# Patient Record
Sex: Female | Born: 1965 | Race: White | Hispanic: Yes | Marital: Married
Health system: Southern US, Community
[De-identification: ages and names within clinical notes are randomized; demographics above are authoritative.]

---

## 2008-06-22 ENCOUNTER — Ambulatory Visit (HOSPITAL_BASED_OUTPATIENT_CLINIC_OR_DEPARTMENT_OTHER): Admission: RE | Admit: 2008-06-22 | Discharge: 2008-06-22 | Payer: Self-pay | Admitting: Orthopedic Surgery

## 2011-01-23 NOTE — Op Note (Signed)
NAMESHALAINE, Krista Davenport                  ACCOUNT NO.:  [id number]   MEDICAL RECORD NO.:  [id number]          PATIENT TYPE:  AMB   LOCATION:  DSC                          FACILITY:  [facility]   PHYSICIAN:  Leonides Grills, M.D.     DATE OF BIRTH:  11/10/1965   DATE OF PROCEDURE:  06/22/2008  DATE OF DISCHARGE:                               OPERATIVE REPORT   PREOPERATIVE DIAGNOSES:  1. Right medial talar dome osteochondral lesion.  2. Right anterior ankle impingement.  3. Right sinus tarsi impingement.   POSTOPERATIVE DIAGNOSES:  1. Right medial talar dome osteochondral lesion.  2. Right anterior ankle impingement.  3. Right sinus tarsi impingement.   OPERATION:  1. Right ankle arthroscopy with extensive debridement.  2. Right debridement and drilling medial talar dome osteochondral      lesion.  3. Right subtalar arthroscopy with sinus tarsi debridement.   ANESTHESIA:  General.   SURGEON:  Leonides Grills, MD   ASSISTANT:  Richardean Canal, PA   ESTIMATED BLOOD LOSS:  Minimal.   TOURNIQUET:  None.   COMPLICATION:  None.   DISPOSITION:  Stable to the PR.   INDICATIONS:  This is a  45 year old female who has had persistent  anterolateral and anteromedial ankle pain that was interfering with her  life to do what she wants to.  She was consented to the above procedure.  All risks of infection or vessel injury, persistent pain, worsening  pain, prolonged recovery, stiffness, arthritis, recurrence of the  osteochondral lesion, and possibility of future debridement drilling of  the lesion were all explained.  Questions were encouraged and answered.  This was all discussed with the patient through an interpreter   OPERATION:  The patient was brought to the operating room and placed in supine  position.  Initially after adequate general endotracheal tube anesthesia  was administered as well as Ancef 1 gram IV piggyback, a bump placed in  the right ipsilateral hip, silicone type internally  rotating the right  lower extremity and the right lower extremity was then prepped and  draped in sterile manner.  No tourniquet was used.  Anatomical landmarks  include the lateral malleolus, peroneus tertius, and anterior tibialis  tendon were mapped out.  Superficial peroneal nerve could not be seen.  We started the procedure with the subtalar arthroscopy.  We created the  anterior portal after a spinal needle was placed into the subtalar joint  and 20 mL normal saline was instilled in the subtalar joint.  We then  utilized a nick and spread technique creating the anterior portal and  dissected down bluntly in a nick and spread technique to the subtalar  joint.  Blunt tip trocar with cannula followed by camera was then placed  into the ankle and the medial portal just anterior to the tip of the  lateral malleolus was then made with a spinal needle, again followed by  nick and spread technique.  There was a large amount of synovitis in the  sinus tarsi area and with range of motion of the subtalar joint this was  flapping  in the joint and impinging in this area.  This was then  extensively debrided with a shaver as well as a radiofrequency bevel.  There was a corner of the anterior process of the calcaneus that had a  lesion on this corner and with range of motion, it was pinching in this  area.  This was also debrided as well.  This extended into the lateral  gutter,0 which was also debrided.  Range of motion of the subtalar joint  did not show any impingement.  There was no other areas of inflammation.  Pictures were obtained throughout the procedure.  Camera was removed.  We then placed a spinal needle just medial to the anterior tibialis  tendon into the ankle joint and 20 mL normal saline instilled in the  ankle joint.  [name] and spread technique was then utilized to create the  anteromedial portal just medial to the anterior tibialis tendon.  Blunt  tip trocar with cannula followed  by camera was then placed in the ankle  and under direct visualization anterolateral portal was created with a  spinal needle followed by nick spread technique.  The accessory tib-fib  ligament was spanning over the anterior lateral edge of the talus and  was literally denting the lateral talar dome.  There was synovitis  surrounding this area as well.  The accessory tib-fib ligament was then  removed and a debridement extensively was done with a shaver as well as  with a radiofrequency bevel.  Once this completely debrided back, the  ankle was ranged and there was no impingement in this area and no other  remaining synovitis.  Pictures were obtained throughout the procedure.  We then placed the camera anterolaterally visualizing medially, again  more synovitis in this area.  This was also debrided with a shaver as  well as by a radiofrequency bevel.  There was an obvious osteochondral  lesion and that was quite large on the medial talar dome.  This was then  debrided with a House curette as well as a shaver down to bone.  There  was a cystic area underneath this area as well and was soft.  We then  debrided this area and drilled it with microfractures technique.  We  limited inflow and this bled beautifully.  There was no loose bodies in  this area and the area was carefully debrided to normal cartilage.  Ankle was ranged and there was no impingement.  Pictures again were  obtained throughout the procedure.  Cameras were removed.  All wounds  were closed with 4-0 nylon stitch.  Sterile dressing was applied.  Modified Jones dressing was applied with the ankle in dorsiflexion.  The  patient went stable to the PR.      Leonides Grills, M.D.  Electronically Signed     PB/MEDQ  D:  06/22/2008  T:  06/23/2008  Job:  [id number]

## 2011-06-12 LAB — POCT HEMOGLOBIN-HEMACUE: Hemoglobin: 13.5

## 2015-04-21 DIAGNOSIS — M899 Disorder of bone, unspecified: Secondary | ICD-10-CM | POA: Insufficient documentation

## 2015-06-16 DIAGNOSIS — M7671 Peroneal tendinitis, right leg: Secondary | ICD-10-CM | POA: Insufficient documentation

## 2015-11-10 DIAGNOSIS — G4733 Obstructive sleep apnea (adult) (pediatric): Secondary | ICD-10-CM | POA: Insufficient documentation

## 2015-11-10 DIAGNOSIS — J4520 Mild intermittent asthma, uncomplicated: Secondary | ICD-10-CM | POA: Insufficient documentation

## 2018-02-20 DIAGNOSIS — M1712 Unilateral primary osteoarthritis, left knee: Secondary | ICD-10-CM | POA: Insufficient documentation

## 2018-03-04 DIAGNOSIS — S83272A Complex tear of lateral meniscus, current injury, left knee, initial encounter: Secondary | ICD-10-CM | POA: Insufficient documentation

## 2021-09-15 ENCOUNTER — Other Ambulatory Visit: Payer: Self-pay

## 2021-09-15 ENCOUNTER — Ambulatory Visit (INDEPENDENT_AMBULATORY_CARE_PROVIDER_SITE_OTHER): Payer: BC Managed Care – PPO

## 2021-09-15 ENCOUNTER — Ambulatory Visit (INDEPENDENT_AMBULATORY_CARE_PROVIDER_SITE_OTHER): Payer: BC Managed Care – PPO | Admitting: Sports Medicine

## 2021-09-15 DIAGNOSIS — M7711 Lateral epicondylitis, right elbow: Secondary | ICD-10-CM

## 2021-09-15 DIAGNOSIS — G8929 Other chronic pain: Secondary | ICD-10-CM

## 2021-09-15 DIAGNOSIS — M7712 Lateral epicondylitis, left elbow: Secondary | ICD-10-CM

## 2021-09-15 DIAGNOSIS — M79671 Pain in right foot: Secondary | ICD-10-CM

## 2021-09-15 IMAGING — DX DG FOOT COMPLETE 3+V*R*
3 series · 3 of 3 positions shown · non-contrast
Comparison: None.

CLINICAL DATA: Pain over the second and third metatarsals.

EXAM:
RIGHT FOOT COMPLETE - 3+ VIEW

[foot ap]
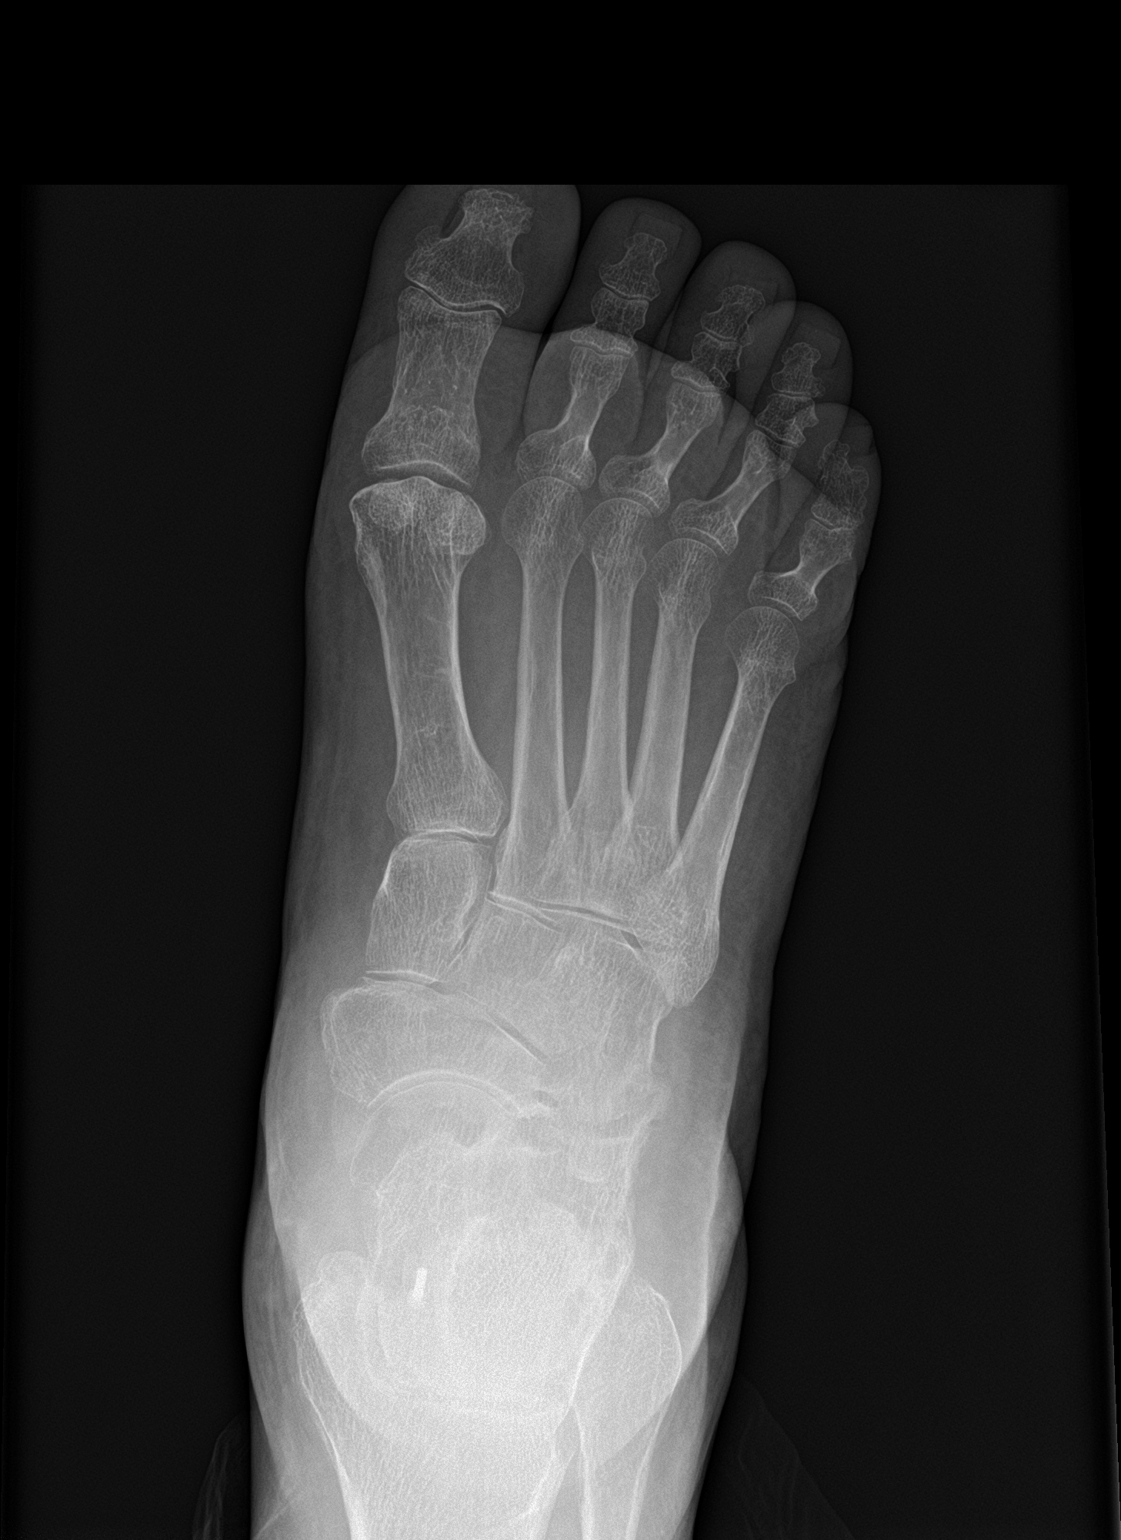

[foot obl]
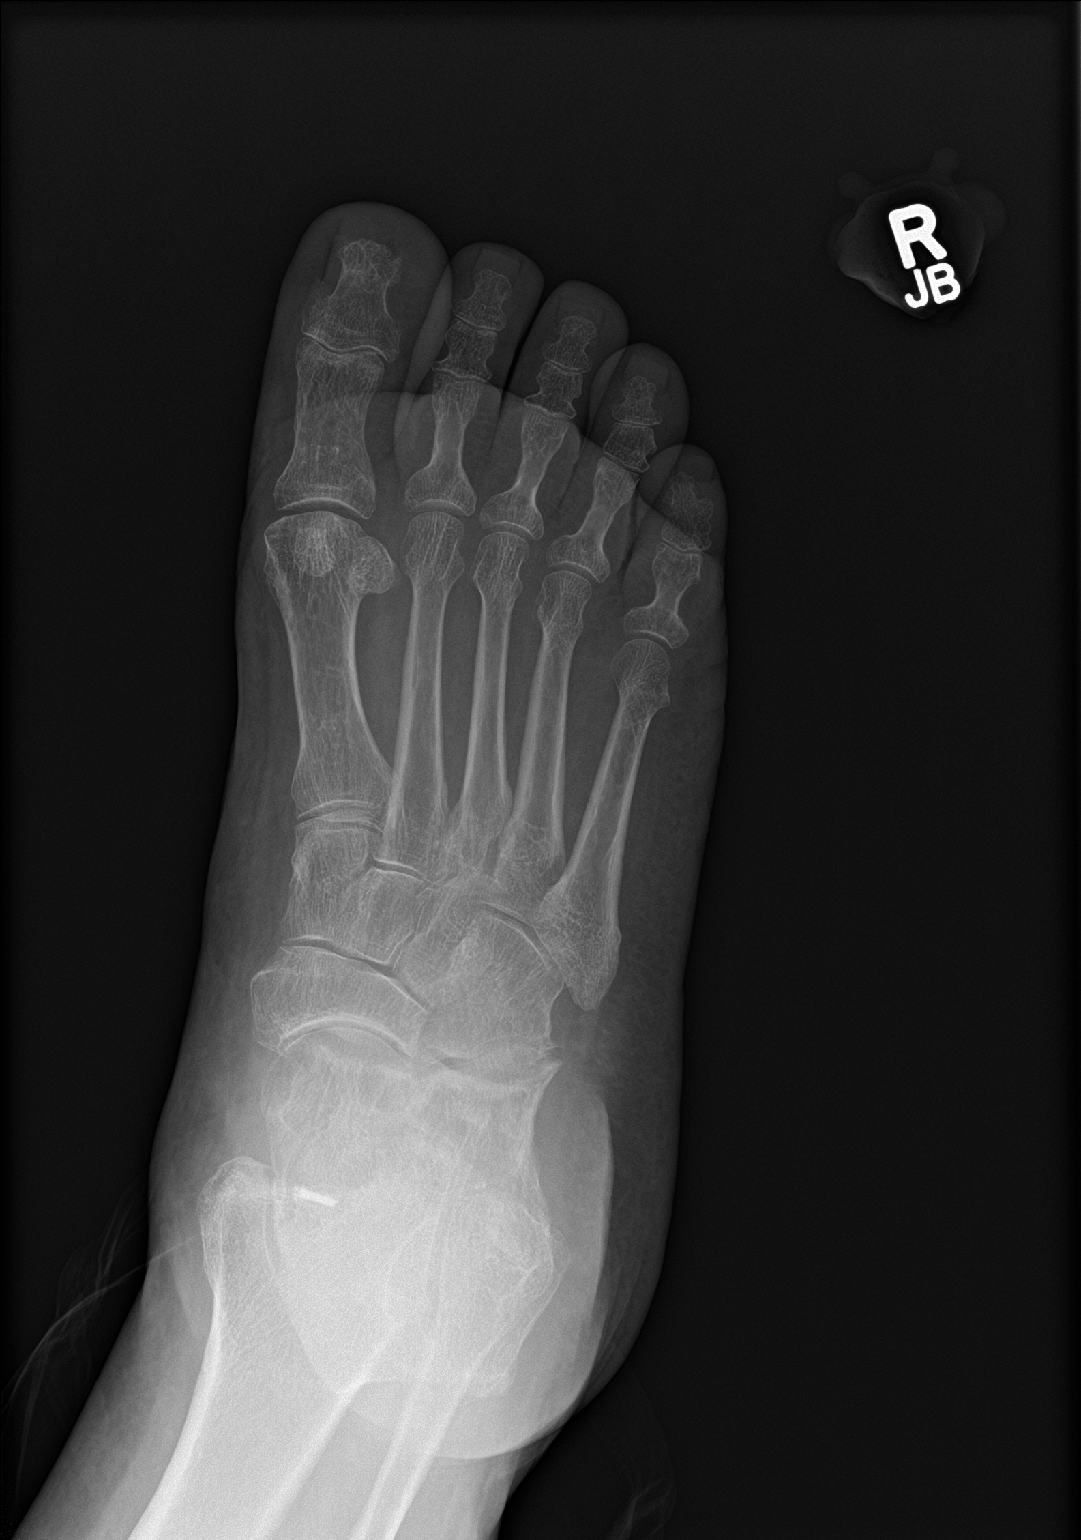

[foot lat]
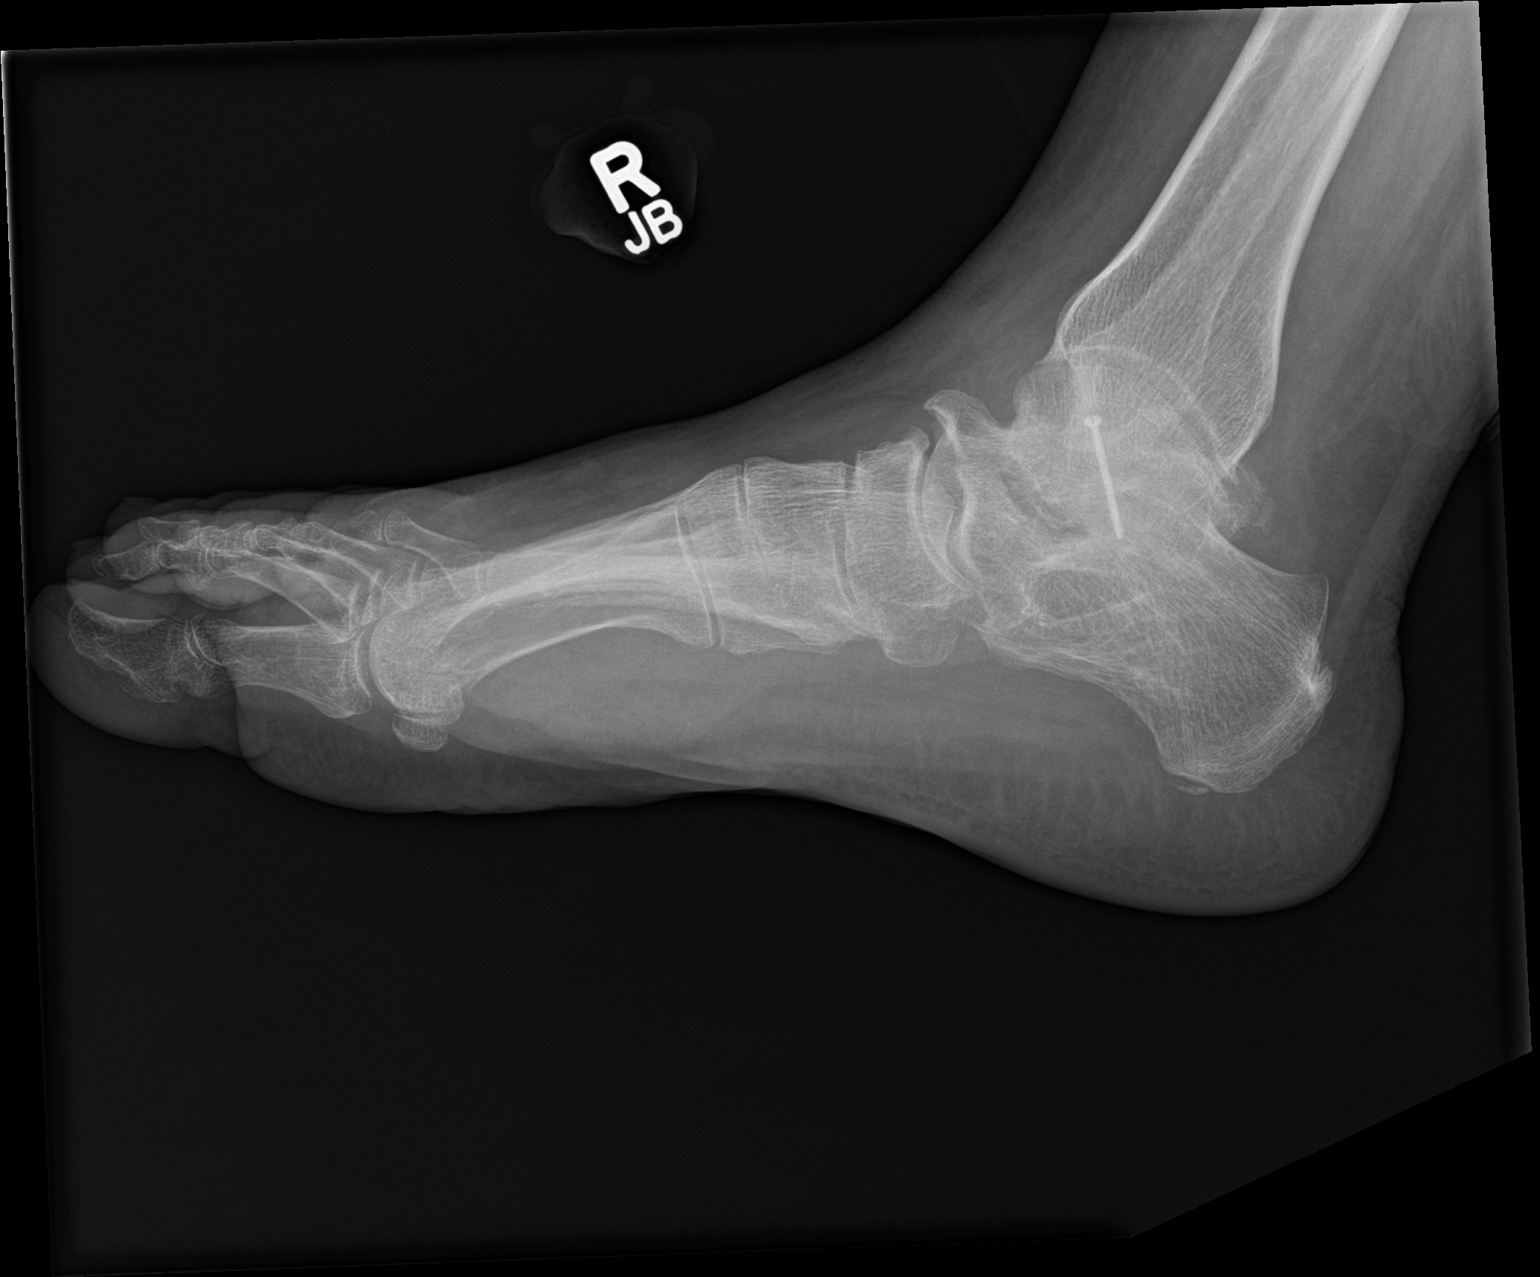

[3 of 3 positions shown; findings below may reference images not displayed]

FINDINGS: No acute fracture or dislocation. The bones are osteopenic.
Degenerative changes of the ankle and hindfoot and talar calcaneal
fusion. A fixation pin is noted. The soft tissues are grossly
unremarkable.
IMPRESSION: No acute fracture or dislocation.

## 2021-09-15 IMAGING — DX DG ELBOW COMPLETE 3+V*R*
4 series · 4 of 4 positions shown · non-contrast
Comparison: None.

CLINICAL DATA: Pain lateral elbow. Lateral epicondylitis. Painful
range of motion.

EXAM:
RIGHT ELBOW - COMPLETE 3+ VIEW

[elbow ap]
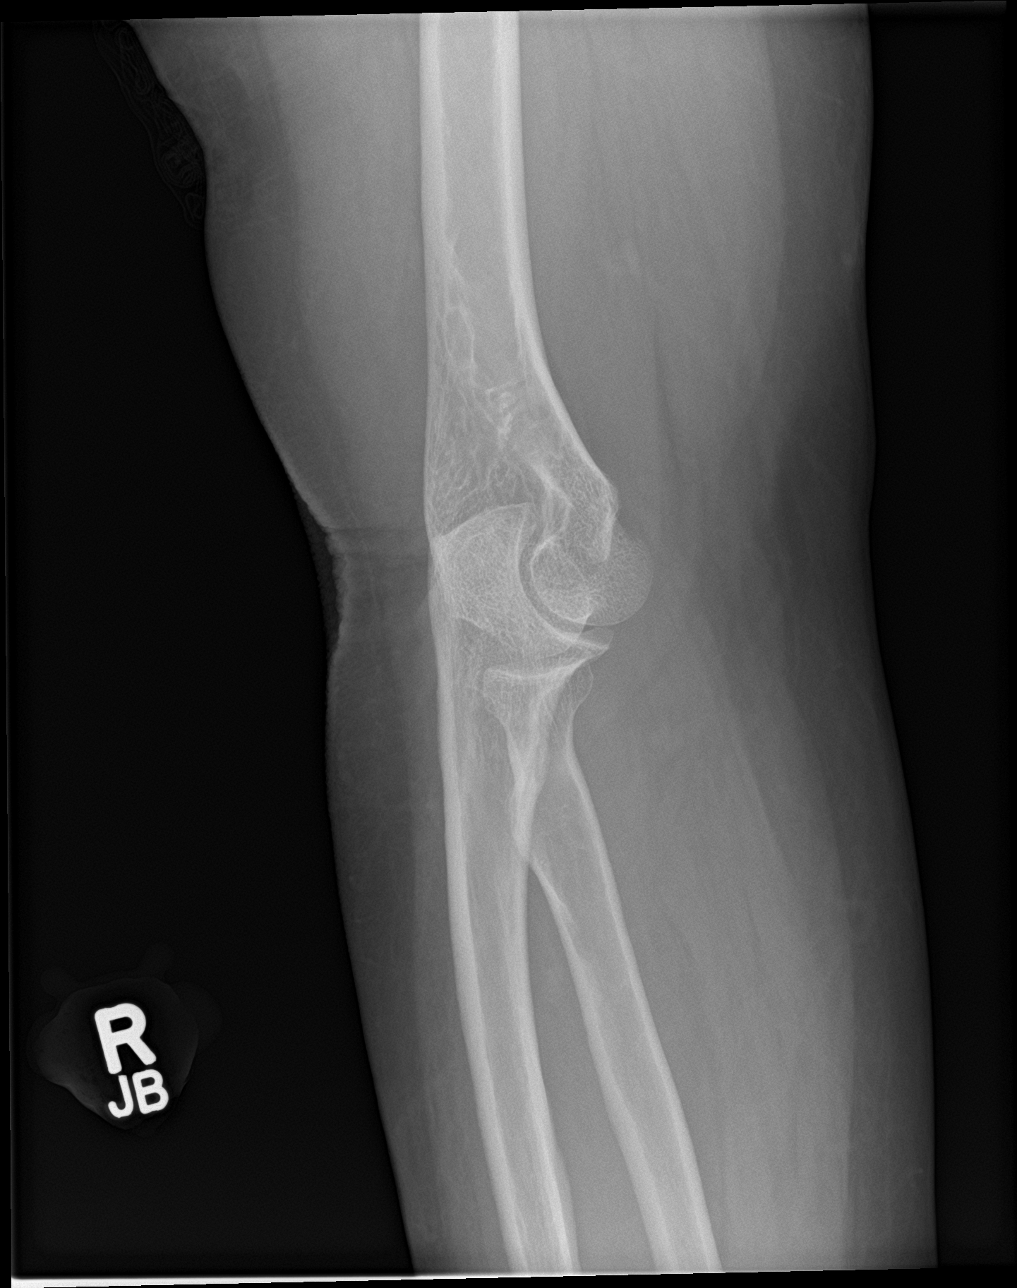

[elbow obl (1 of 2)]
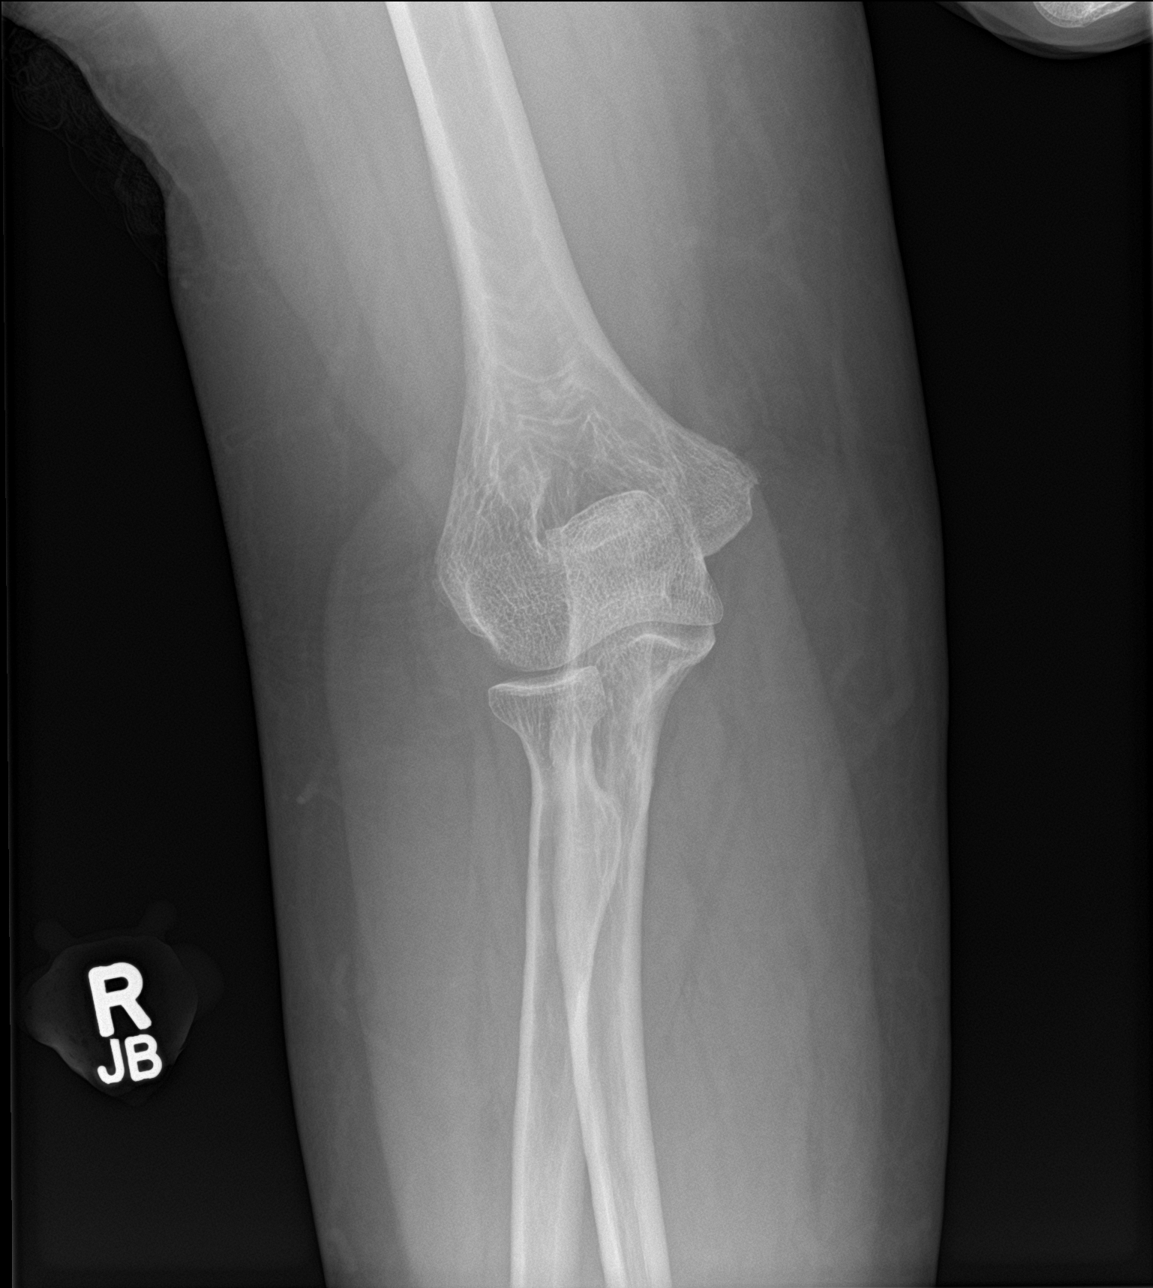

[elbow obl (2 of 2)]
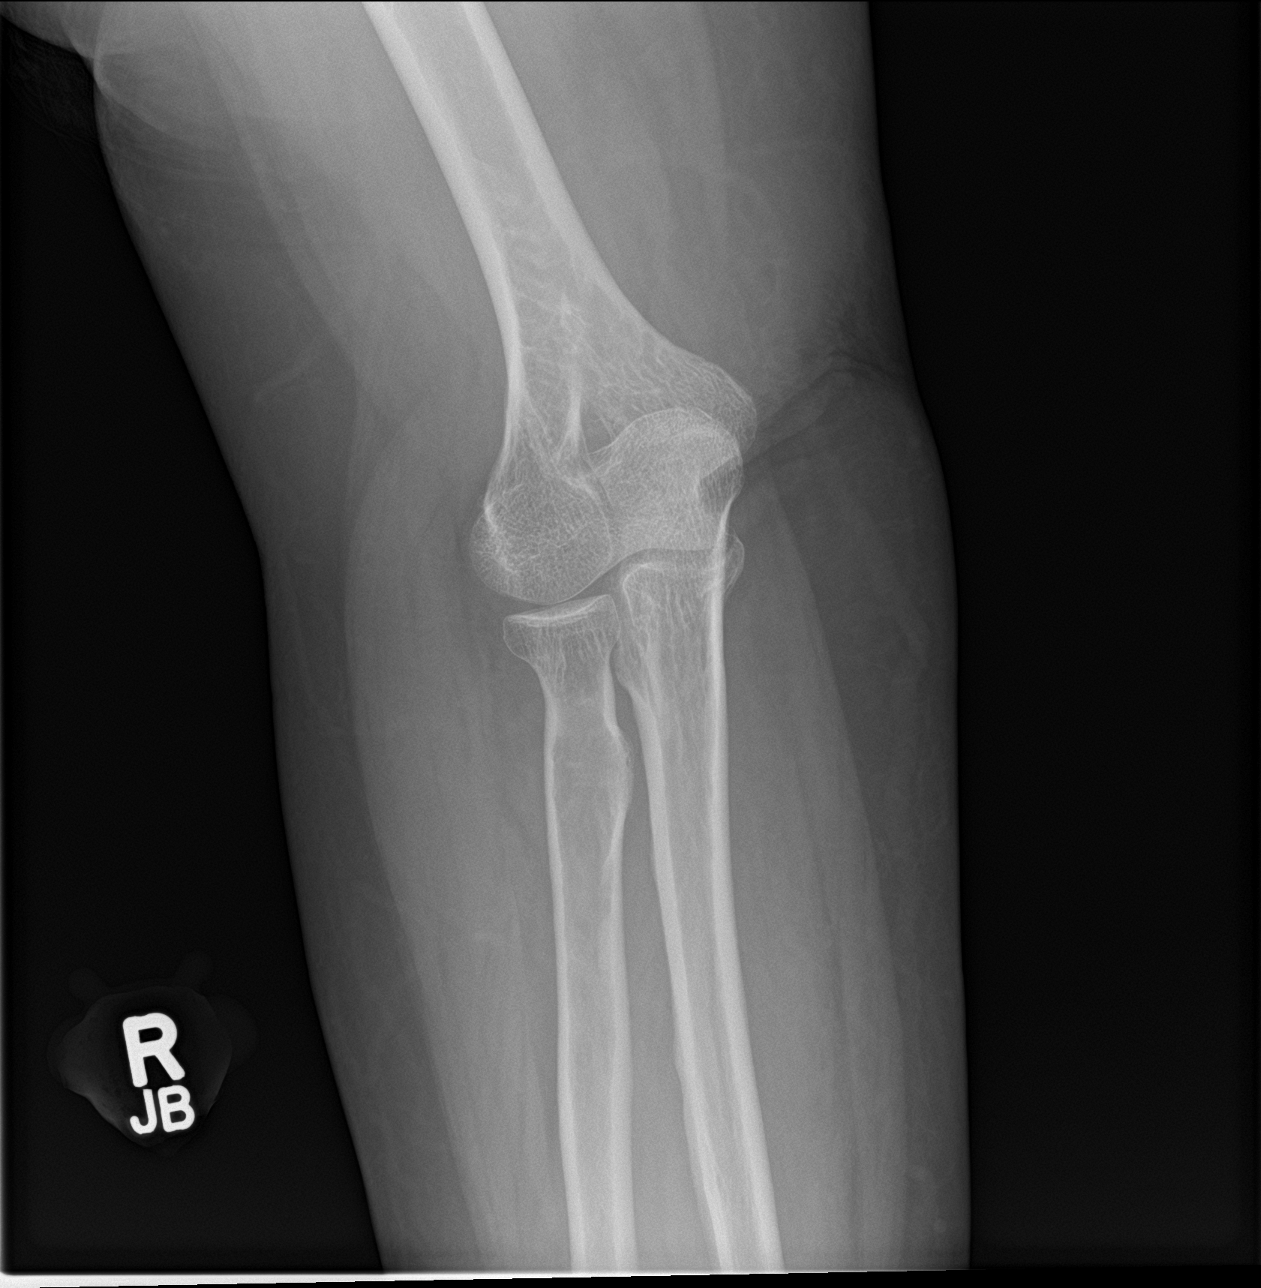

[elbow lat]
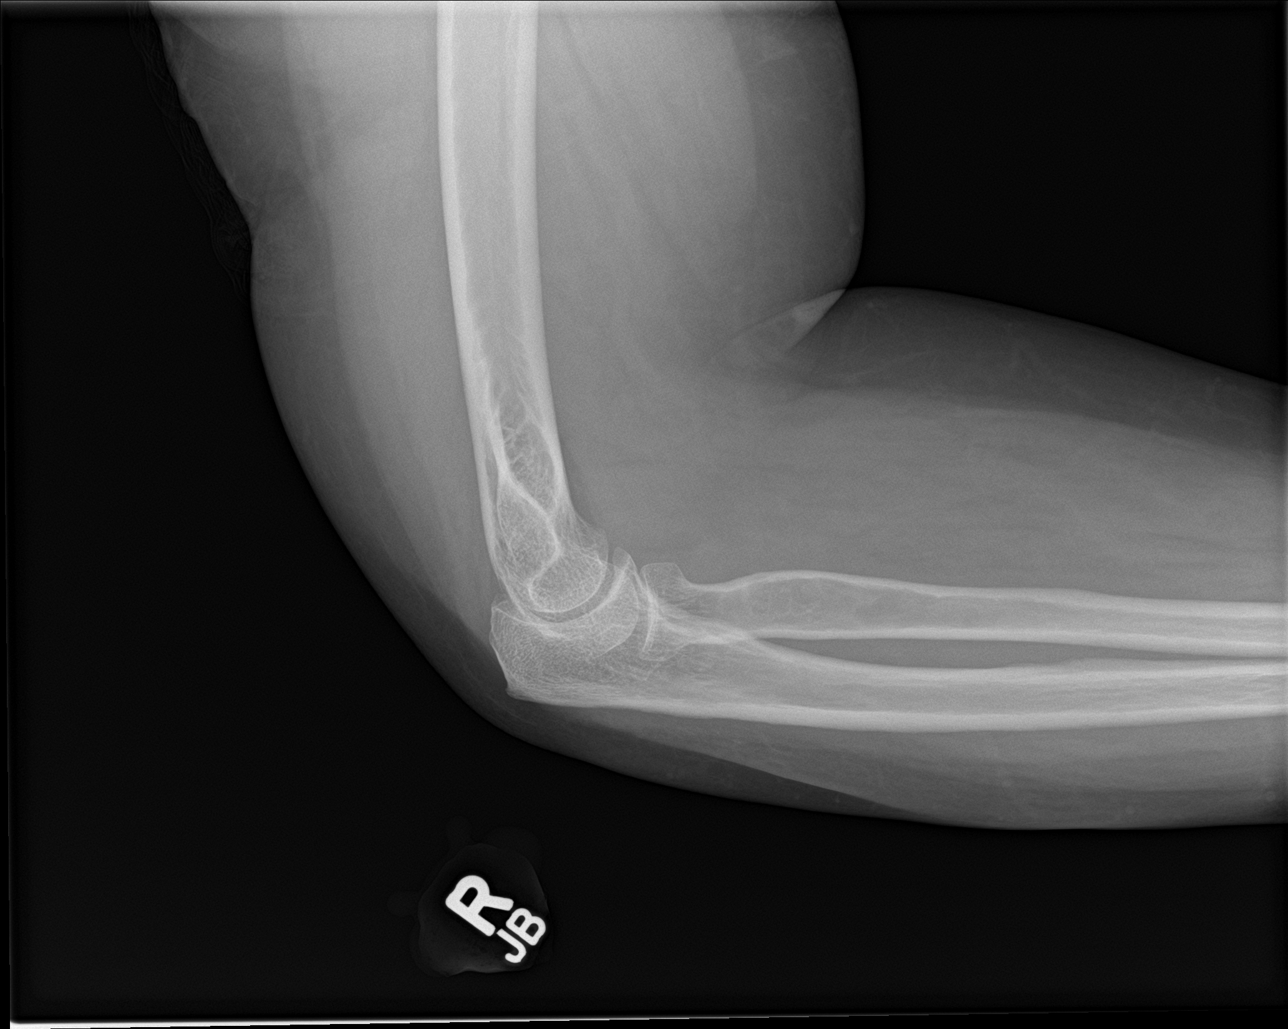

[4 of 4 positions shown; findings below may reference images not displayed]

FINDINGS: There is no evidence of fracture, dislocation, or joint effusion.
Normal alignment. Joint spaces are preserved. There is no evidence
of arthropathy or other focal bone abnormality. Soft tissues are
unremarkable.
IMPRESSION: Negative radiographs of the right elbow.

## 2021-09-15 MED ORDER — MELOXICAM 15 MG PO TABS: ORAL_TABLET | ORAL | 3 refills | Status: AC

## 2021-09-15 MED ORDER — MELOXICAM 15 MG PO TABS: ORAL_TABLET | ORAL | 3 refills | Status: DC

## 2021-09-15 NOTE — Assessment & Plan Note (Signed)
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 On exam she has swelling and tenderness second and third mid metatarsal shafts concerning for stress injury. We will get x-rays of her foot, she also has a cam boot at home from her ankle surgery, she will wear this for the next month, I did explain that this could take 12 weeks to get better.

## 2021-09-15 NOTE — Progress Notes (Signed)
° ° °  Procedures performed today:    None.  Independent interpretation of notes and tests performed by another provider:   None.  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 On exam she has swelling and tenderness second and third mid metatarsal shafts concerning for stress injury. We will get x-rays of her foot, she also has a cam boot at home from her ankle surgery, she will wear this for the next month, I did explain that this could take 12 weeks to get better.  Lateral epicondylitis of both elbows Over a month of pain right lateral elbow, worse with gripping motions, tenderness at the common extensor tendon origin, worse with resisted extension of the wrist, this is classic tennis elbow, adding tennis elbow brace, meloxicam, x-rays, formal physical therapy. Return to see me in 4 weeks, injection if no better.  Chronic process with exacerbation and pharmacologic intervention  ___________________________________________ Gwen Her. Dianah Field, M.D., ABFM., CAQSM. Primary Care and [place] Instructor of [place] [employer] [facility] [employer]

## 2021-09-15 NOTE — Addendum Note (Signed)
Addended by: Gust Brooms on: 09/15/2021 04:30 PM   Modules accepted: Orders

## 2021-09-15 NOTE — Assessment & Plan Note (Signed)
Over a month of pain right lateral elbow, worse with gripping motions, tenderness at the common extensor tendon origin, worse with resisted extension of the wrist, this is classic tennis elbow, adding tennis elbow brace, meloxicam, x-rays, formal physical therapy. Return to see me in 4 weeks, injection if no better.

## 2021-10-06 ENCOUNTER — Ambulatory Visit: Payer: BC Managed Care – PPO | Admitting: Physical Therapy

## 2021-10-13 ENCOUNTER — Other Ambulatory Visit: Payer: Self-pay

## 2021-10-13 ENCOUNTER — Ambulatory Visit: Payer: BC Managed Care – PPO | Admitting: Sports Medicine

## 2021-10-13 DIAGNOSIS — M79671 Pain in right foot: Secondary | ICD-10-CM | POA: Diagnosis not present

## 2021-10-13 DIAGNOSIS — M7712 Lateral epicondylitis, left elbow: Secondary | ICD-10-CM | POA: Diagnosis not present

## 2021-10-13 DIAGNOSIS — G8929 Other chronic pain: Secondary | ICD-10-CM | POA: Diagnosis not present

## 2021-10-13 DIAGNOSIS — M7711 Lateral epicondylitis, right elbow: Secondary | ICD-10-CM

## 2021-10-13 NOTE — Progress Notes (Signed)
° ° °  Procedures performed today:    None.  Independent interpretation of notes and tests performed by another provider:   None.  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 X-rays showed arthritic changes, she has failed greater than 6 weeks of physician directed conservative treatments we will not proceed with MRI of the foot and ankle for injection planning. We will put her on light duty at work, I do not think she wear her boot very much.  Lateral epicondylitis of both elbows Resolved with conservative measures.  I spent 30 minutes of total time managing this patient today, this includes chart review, face to face, and non-face to face time.  ___________________________________________ Ihor Austin. Benjamin Stain, M.D., ABFM., CAQSM. Primary Care and Sports Medicine [facility]  Adjunct Instructor of Family Medicine  [employer]

## 2021-10-13 NOTE — Assessment & Plan Note (Signed)
Resolved with conservative measures. 

## 2021-10-13 NOTE — Assessment & Plan Note (Signed)
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 X-rays showed arthritic changes, she has failed greater than 6 weeks of physician directed conservative treatments we will not proceed with MRI of the foot and ankle for injection planning. We will put her on light duty at work, I do not think she wear her boot very much.

## 2021-10-16 ENCOUNTER — Other Ambulatory Visit: Payer: Self-pay

## 2021-10-21 ENCOUNTER — Ambulatory Visit (INDEPENDENT_AMBULATORY_CARE_PROVIDER_SITE_OTHER): Payer: BC Managed Care – PPO

## 2021-10-21 ENCOUNTER — Other Ambulatory Visit: Payer: Self-pay

## 2021-10-21 DIAGNOSIS — M79671 Pain in right foot: Secondary | ICD-10-CM

## 2021-10-21 DIAGNOSIS — G8929 Other chronic pain: Secondary | ICD-10-CM | POA: Diagnosis not present

## 2021-10-21 IMAGING — MR MR ANKLE*R* W/O CM
5 series · 40 of 40 positions shown · non-contrast
Comparison: None.

CLINICAL DATA: Dorsal foot pain along the second and third
metatarsals. Right ankle pain for 15 years. Prior right ankle
surgery [DATE].

EXAM:
MRI OF THE RIGHT ANKLE WITHOUT CONTRAST
MRI OF THE RIGHT FOREFOOT WITHOUT CONTRAST
TECHNIQUE: Multiplanar, multisequence MR imaging of the ankle was performed. No
intravenous contrast was administered.
Multiplanar, multisequence MR imaging of the foot was performed. No

[Series 4: PD fat-sat · axial · 3.0mm · 0.70mm/px · z∈[-72,+67]mm · 9 of 43 slices shown]
[im 1/43]
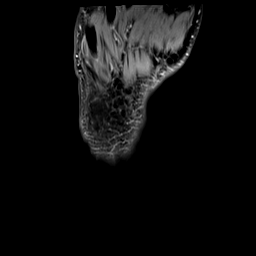
[im 6/43]
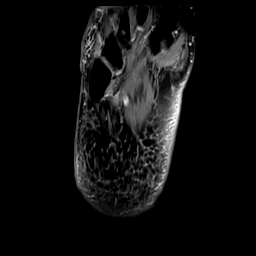
[im 11/43]
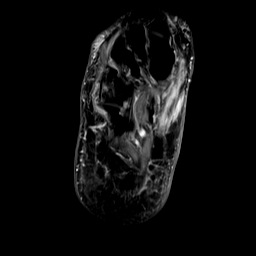
[im 16/43]
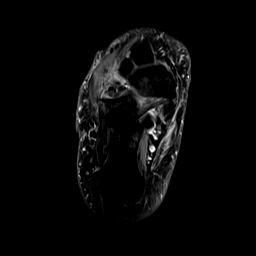
[im 22/43]
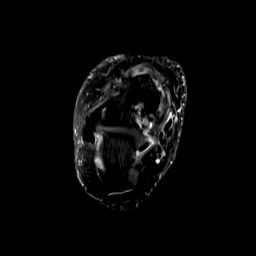
[im 27/43]
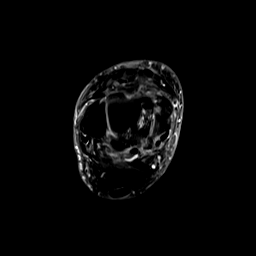
[im 32/43]
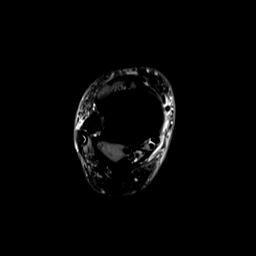
[im 37/43]
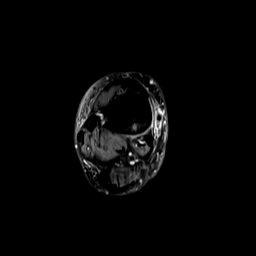
[im 43/43]
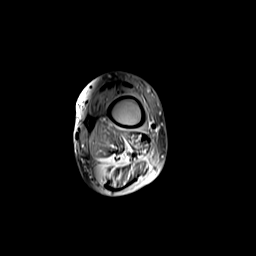

[Series 5: T2 fat-sat · axial · 3.0mm · 0.70mm/px · z∈[-72,+67]mm · 9 of 43 slices shown]
[im 1/43]
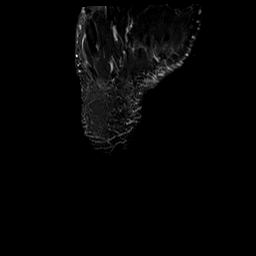
[im 6/43]
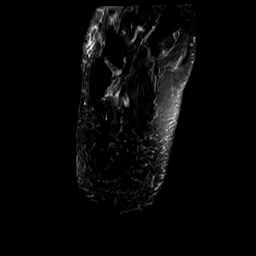
[im 11/43]
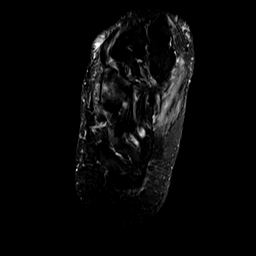
[im 16/43]
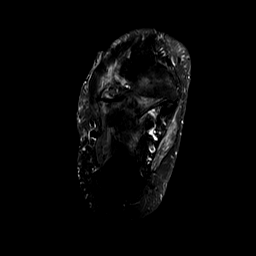
[im 22/43]
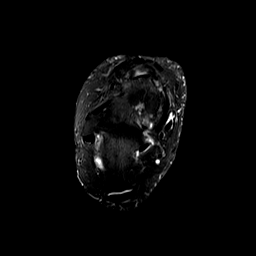
[im 27/43]
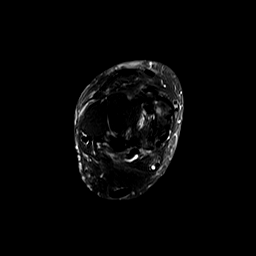
[im 32/43]
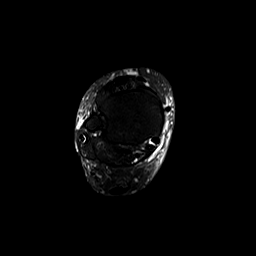
[im 37/43]
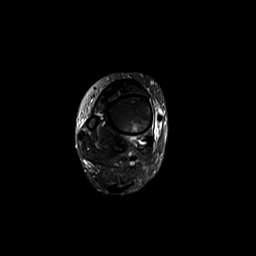
[im 43/43]
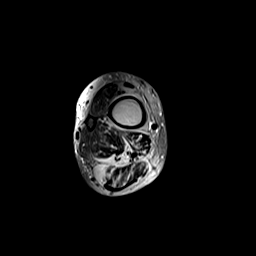

[Series 6: STIR · coronal · 3.0mm · 0.70mm/px · 10 of 45 slices shown (1 of 2)]
[im 1/45]
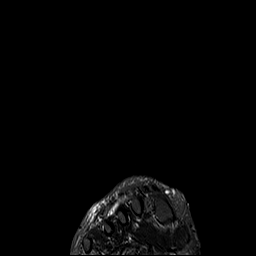
[im 5/45]
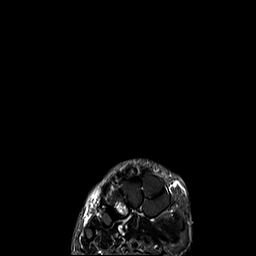
[im 10/45]
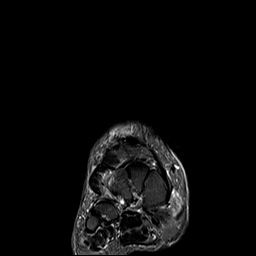
[im 15/45]
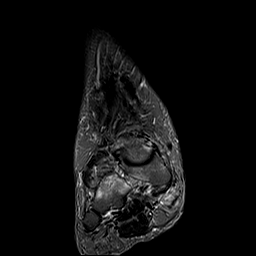
[im 20/45]
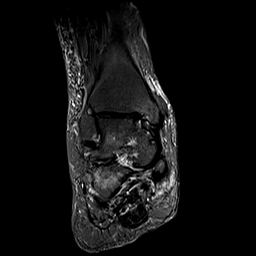
[im 25/45]
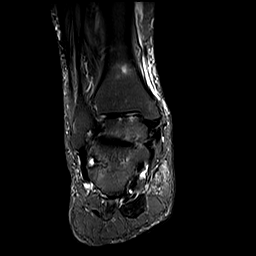
[im 30/45]
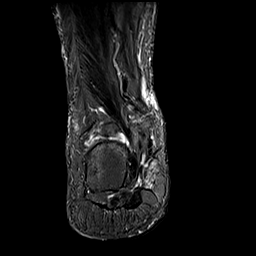
[im 35/45]
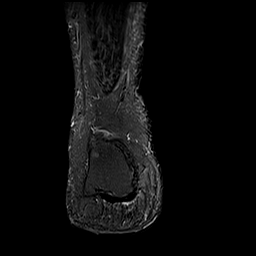
[im 40/45]
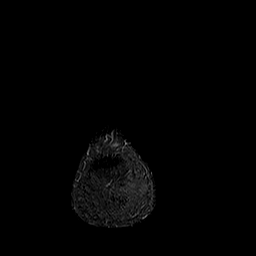
[im 45/45]
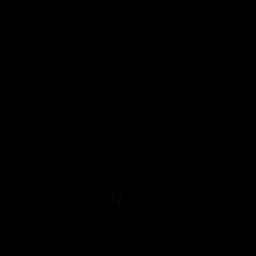

[Series 7: T1 · sagittal · 3.0mm · 0.56mm/px · 6 of 26 slices shown]
[im 1/26]
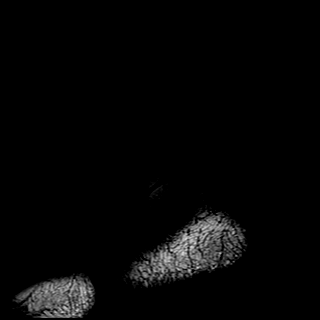
[im 6/26]
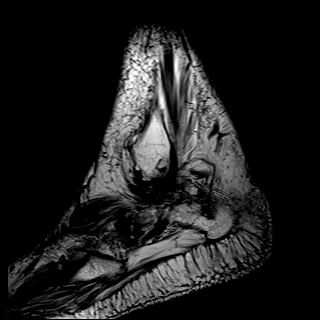
[im 11/26]
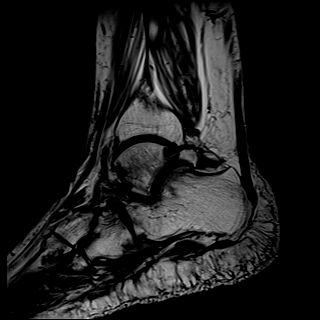
[im 16/26]
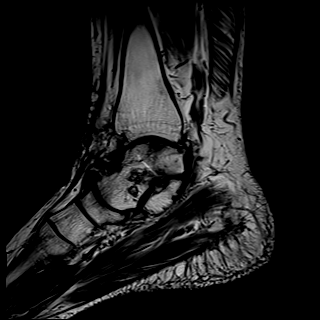
[im 21/26]
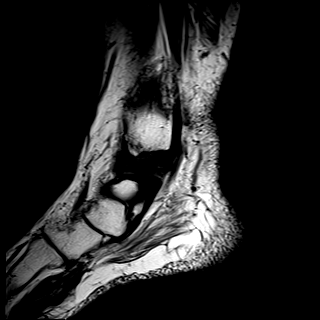
[im 26/26]
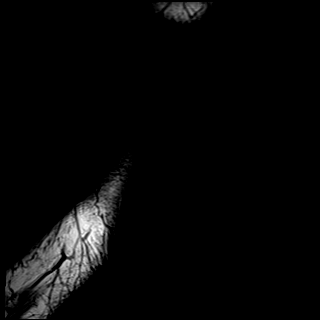

[Series 8: STIR · sagittal · 3.0mm · 0.70mm/px · 6 of 26 slices shown (2 of 2)]
[im 1/26]
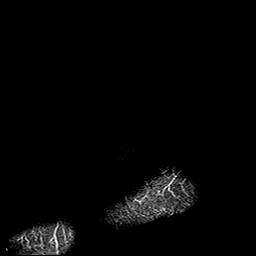
[im 6/26]
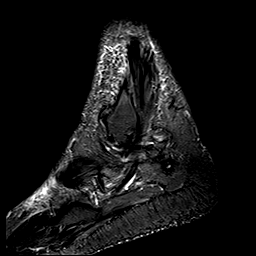
[im 11/26]
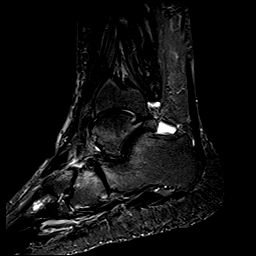
[im 16/26]
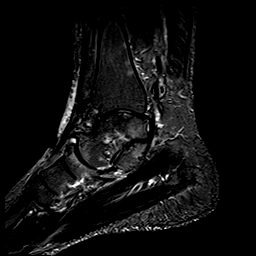
[im 21/26]
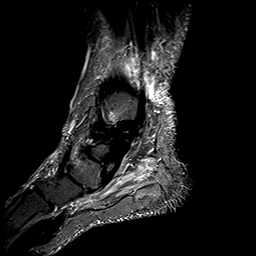
[im 26/26]
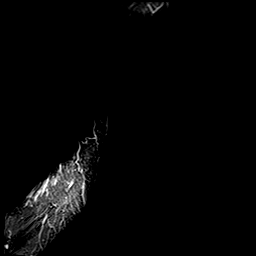

[40 of 40 positions shown; findings below may reference images not displayed]

FINDINGS: TENDONS

Peroneal: Mild tendinosis of the peroneus longus with mild
tenosynovitis. Peroneal brevis intact.

Posteromedial: Posterior tibial tendon intact. Flexor hallucis
longus tendon intact. Flexor digitorum longus tendon intact.

Anterior: Tibialis anterior tendon intact. Extensor hallucis longus
tendon intact Extensor digitorum longus tendon intact.

Achilles:  Intact.

Plantar Fascia: Intact.

LIGAMENTS

Lateral: Anterior talofibular ligament intact. Calcaneofibular
ligament intact. Posterior talofibular ligament intact. Anterior and
posterior tibiofibular ligaments intact.

Medial: Deltoid ligament intact. Spring ligament intact.

Lisfranc: Intact

Collaterals: Intact

CARTILAGE

Ankle Joint: No joint effusion. Single screw in the anteromedial
aspect of the talar dome. Full-thickness cartilage loss throughout
the tibiotalar joint with focal subchondral reactive marrow edema
involving the medial talar dome consistent with severe
osteoarthritis. Postsurgical changes in fibrosis along the anterior
margin of the tibiotalar joint.

Subtalar Joints/Sinus Tarsi: Normal sinus tarsi. Mild osteoarthritis
of the posterior subtalar joint with subchondral reactive marrow
edema.

Bones: Focal severe bone marrow edema at the base of the third
metatarsal most concerning for stress reaction without a fracture.
Mild osteoarthritis of the first MTP joint. Moderate osteoarthritis
of the talonavicular joint with subchondral reactive marrow edema
and a bulky dorsal anterior talar osteophyte. Moderate
osteoarthritis of the calcaneocuboid joint with subchondral reactive
marrow edema.

Soft Tissue: No fluid collection or hematoma. Muscles are normal
without edema or atrophy. Tarsal tunnel is normal.
IMPRESSION: 1. Mild tendinosis of the peroneus longus with mild tenosynovitis.
2. Severe osteoarthritis of the tibiotalar joint.
3. Focal severe bone marrow edema at the base of the third
metatarsal most concerning for stress reaction without a fracture.
4. Mild osteoarthritis of the first MTP joint.
5. Moderate osteoarthritis of the talonavicular joint with
subchondral reactive marrow edema and a bulky dorsal anterior talar
osteophyte.
6. Moderate osteoarthritis of the calcaneocuboid joint with
subchondral reactive marrow edema.

## 2021-11-01 ENCOUNTER — Telehealth: Payer: Self-pay

## 2021-11-01 NOTE — Telephone Encounter (Signed)
Due to language barrier, spoke with daughter regarding results. She is aware of the results and recommendations and will notify patient. She was transferred to the front desk for scheduling of injection as recommended.

## 2021-11-10 ENCOUNTER — Ambulatory Visit (INDEPENDENT_AMBULATORY_CARE_PROVIDER_SITE_OTHER): Payer: BC Managed Care – PPO

## 2021-11-10 ENCOUNTER — Ambulatory Visit: Payer: BC Managed Care – PPO | Admitting: Sports Medicine

## 2021-11-10 ENCOUNTER — Other Ambulatory Visit: Payer: Self-pay

## 2021-11-10 DIAGNOSIS — M79671 Pain in right foot: Secondary | ICD-10-CM

## 2021-11-10 DIAGNOSIS — G8929 Other chronic pain: Secondary | ICD-10-CM

## 2021-11-10 MED ORDER — HYDROCODONE-ACETAMINOPHEN 5-325 MG PO TABS: 1.0000 | ORAL_TABLET | Freq: Three times a day (TID) | ORAL | 0 refills | Status: DC | PRN

## 2021-11-10 NOTE — Progress Notes (Signed)
? ? ?  Procedures performed today:   ? ?Procedure: Real-time Ultrasound Guided injection of the right tibiotalar joint ?Device: Samsung HS60  ?Verbal informed consent obtained.  ?Time-out conducted.  ?Noted no overlying erythema, induration, or other signs of local infection.  ?Skin prepped in a sterile fashion.  ?Local anesthesia: Topical Ethyl chloride.  ?With sterile technique and under real time ultrasound guidance: Noted synovitis and severe arthritic change, 1 cc kenalog 40, 1 cc lidocaine, 1 cc bupivacaine injected easily. ?Completed without difficulty  ?Advised to call if fevers/chills, erythema, induration, drainage, or persistent bleeding.  ?Images permanently stored and available for review in PACS.  ?Impression: Technically successful ultrasound guided injection. ? ?Independent interpretation of notes and tests performed by another provider:  ? ?None. ? ?Brief History, Exam, Impression, and Recommendations:   ? 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 ?There is also a stress injury base of the third metatarsal. ?Today her pain is predominantly at the tibiotalar joint, this was injected today with ultrasound guidance and she reported over 70% improvement seconds after the injection. ?Because she has multiple other pathologic findings including potential stress injury at the base of the third metatarsal I would like her to wear a cam boot for the next month, I would like to see her back at which point if she is still having pain over the midfoot we can try a talonavicular or calcaneocuboid joint injection. ?We will do hydrocodone for pain in the meantime as  she does have to go to [place] in a couple of weeks. ?I did explain with her husband translating, that it was not possible to cure the arthritis but we would manage it as long as possible, and ultimately she may need a tibiotalar joint arthrodesis if insufficient improvement. ? ? ? ?___________________________________________ ?Ihor Austin. Benjamin Stain, M.D., ABFM., CAQSM. ?Primary Care and Sports Medicine ?[facility] ? ?Adjunct Instructor of Family Medicine  ?[employer] ?

## 2021-11-10 NOTE — Addendum Note (Signed)
Addended by: Monica Becton on: 11/10/2021 04:10 PM ? ? Modules accepted: Orders ? ?

## 2021-11-10 NOTE — Assessment & Plan Note (Signed)
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 ?There is also a stress injury base of the third metatarsal. ?Today her pain is predominantly at the tibiotalar joint, this was injected today with ultrasound guidance and she reported over 70% improvement seconds after the injection. ?Because she has multiple other pathologic findings including potential stress injury at the base of the third metatarsal I would like her to wear a cam boot for the next month, I would like to see her back at which point if she is still having pain over the midfoot we can try a talonavicular or calcaneocuboid joint injection. ?We will do hydrocodone for pain in the meantime as she does have to go to [place] in a couple of weeks. ?I did explain with her husband translating, that it was not possible to cure the arthritis but we would manage it as long as possible, and ultimately she may need a tibiotalar joint arthrodesis if insufficient improvement. ?

## 2021-11-21 ENCOUNTER — Ambulatory Visit: Payer: BC Managed Care – PPO | Admitting: Sports Medicine

## 2021-11-21 ENCOUNTER — Other Ambulatory Visit: Payer: Self-pay

## 2021-11-21 DIAGNOSIS — M79671 Pain in right foot: Secondary | ICD-10-CM

## 2021-11-21 DIAGNOSIS — G8929 Other chronic pain: Secondary | ICD-10-CM | POA: Diagnosis not present

## 2021-11-21 NOTE — Progress Notes (Signed)
? ? ?  Procedures performed today:   ? ?None. ? ?Independent interpretation of notes and tests performed by another provider:  ? ?None. ? ?Brief History, Exam, Impression, and Recommendations:   ? ?Chronic foot pain, right ?Pleasant 56 year old female, morbid obesity, severe chronic right foot pain, she has had multiple interventions including arthroscopy ankle surgery for a talar dome osteochondral defect in the distant past, ultimately an MRI showed multiple pathologic findings including severe tibiotalar joint osteoarthritis as well as moderate to severe osteoarthritis in the talonavicular and calcaneocuboid joints. ?She also had a stress injury base of the third metatarsal bone. ?At the last visit her pain was referable predominantly to the tibiotalar joint so I injected this joint with ultrasound guidance and she returns today with 100% improvement in her pain. ?She still is in the boot. ?I want to keep her in the boot considering her stress injury, we filled out FMLA paperwork today. ?Return to see me in 1 month at which point we will probably discontinue the boot. ? ?I spent 30 minutes of total time managing this patient today, this includes chart review, face to face, and non-face to face time.  Visit was conducted with interpreter. ? ?___________________________________________ ?Ihor Austin. Benjamin Stain, M.D., ABFM., CAQSM. ?Primary Care and Sports Medicine ?[facility] ? ?Adjunct Instructor of Family Medicine  ?[employer] ?

## 2021-11-21 NOTE — Assessment & Plan Note (Signed)
Pleasant 56 year old female, morbid obesity, severe chronic right foot pain, she has had multiple interventions including arthroscopy ankle surgery for a talar dome osteochondral defect in the distant past, ultimately an MRI showed multiple pathologic findings including severe tibiotalar joint osteoarthritis as well as moderate to severe osteoarthritis in the talonavicular and calcaneocuboid joints. ?She also had a stress injury base of the third metatarsal bone. ?At the last visit her pain was referable predominantly to the tibiotalar joint so I injected this joint with ultrasound guidance and she returns today with 100% improvement in her pain. ?She still is in the boot. ?I want to keep her in the boot considering her stress injury, we filled out FMLA paperwork today. ?Return to see me in 1 month at which point we will probably discontinue the boot. ?

## 2021-11-24 ENCOUNTER — Encounter: Payer: Self-pay | Admitting: Sports Medicine

## 2021-12-29 ENCOUNTER — Ambulatory Visit: Payer: BC Managed Care – PPO | Admitting: Sports Medicine

## 2021-12-29 ENCOUNTER — Ambulatory Visit (INDEPENDENT_AMBULATORY_CARE_PROVIDER_SITE_OTHER): Payer: BC Managed Care – PPO

## 2021-12-29 DIAGNOSIS — M1711 Unilateral primary osteoarthritis, right knee: Secondary | ICD-10-CM | POA: Diagnosis not present

## 2021-12-29 DIAGNOSIS — Z09 Encounter for follow-up examination after completed treatment for conditions other than malignant neoplasm: Secondary | ICD-10-CM

## 2021-12-29 DIAGNOSIS — G8929 Other chronic pain: Secondary | ICD-10-CM | POA: Diagnosis not present

## 2021-12-29 DIAGNOSIS — M79671 Pain in right foot: Secondary | ICD-10-CM | POA: Diagnosis not present

## 2021-12-29 DIAGNOSIS — M170 Bilateral primary osteoarthritis of knee: Secondary | ICD-10-CM | POA: Insufficient documentation

## 2021-12-29 IMAGING — DX DG KNEE 1-2V*L*
4 series · 4 of 4 positions shown · non-contrast
Comparison: None.

CLINICAL DATA: Right knee pain for 1 month.

EXAM:
RIGHT KNEE - COMPLETE 4+ VIEW; LEFT KNEE - 2 VIEW

[knee lat]
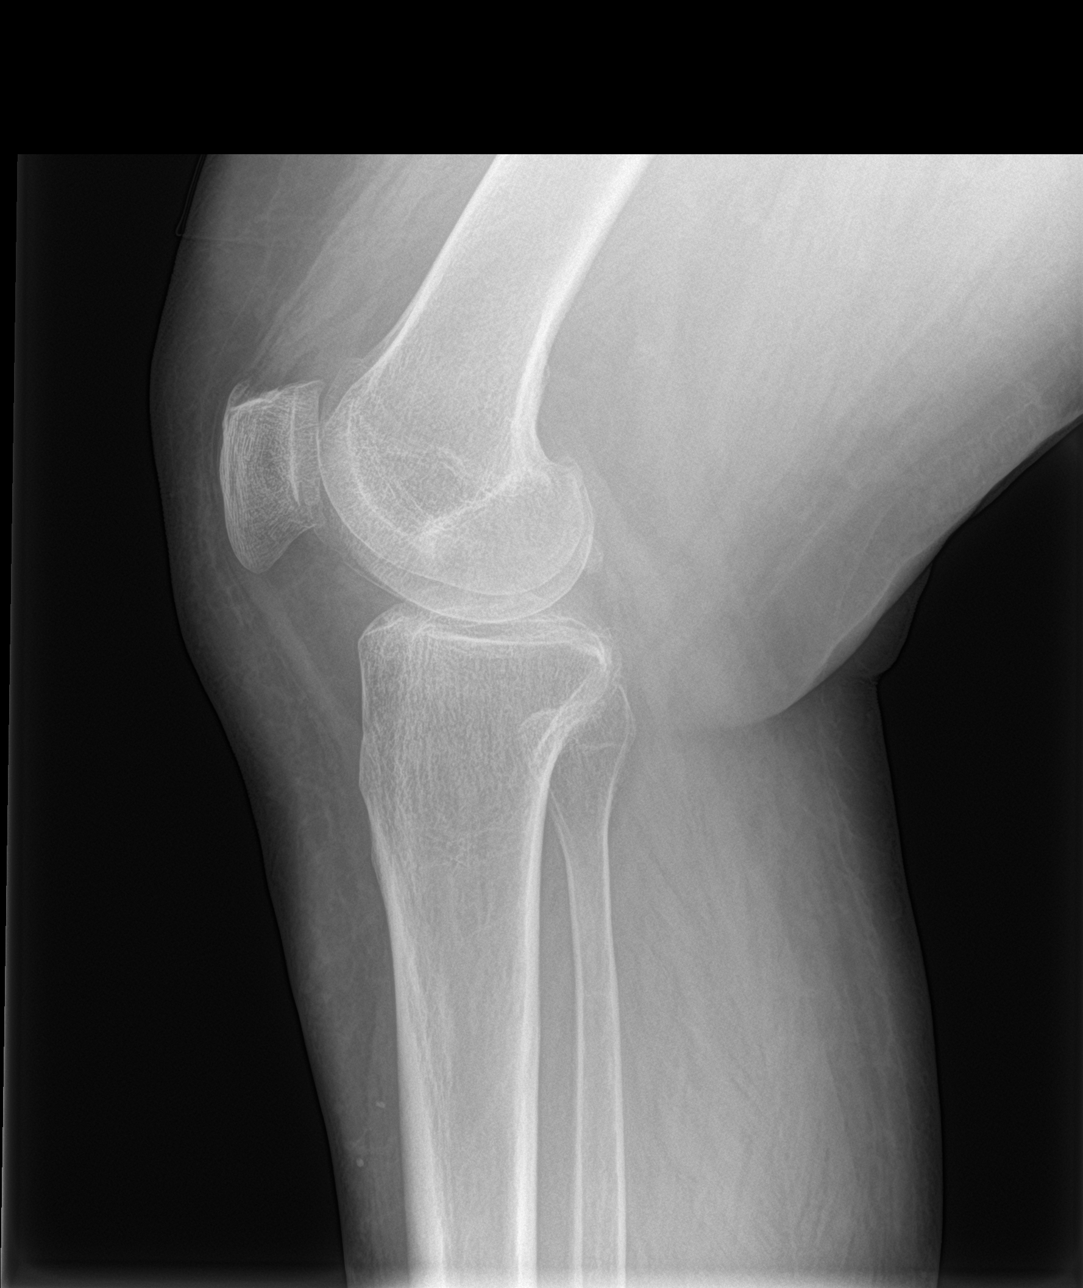

[knee ap bilat standing (1 of 2)]
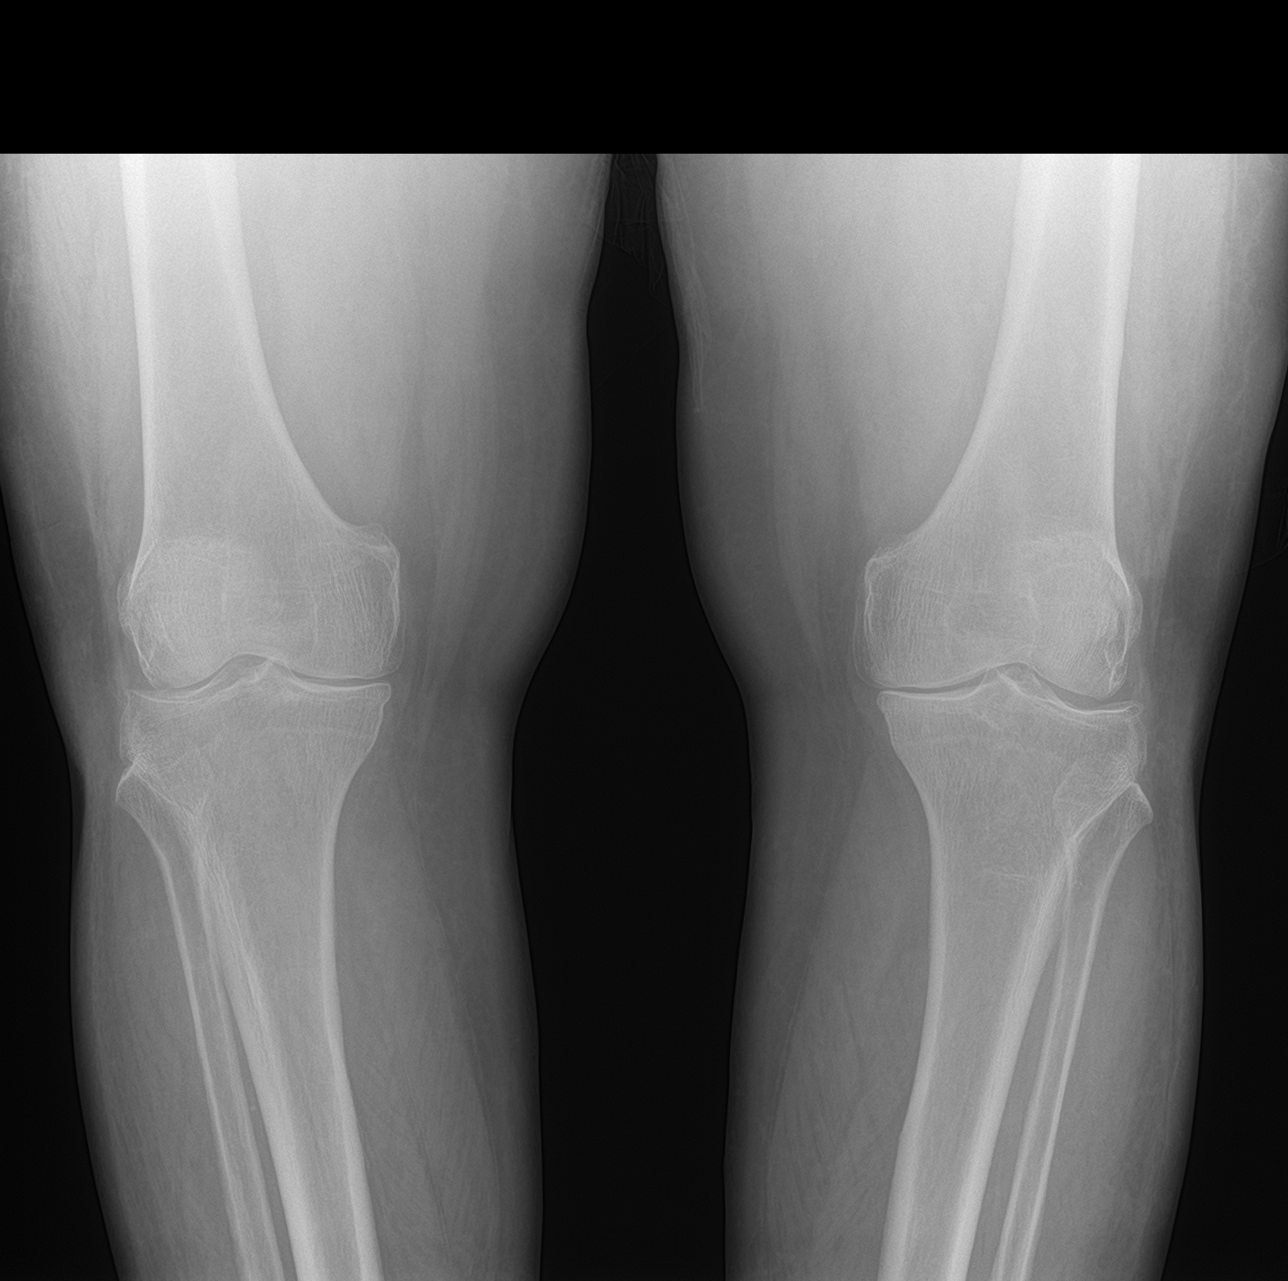

[knee sunrise standing]
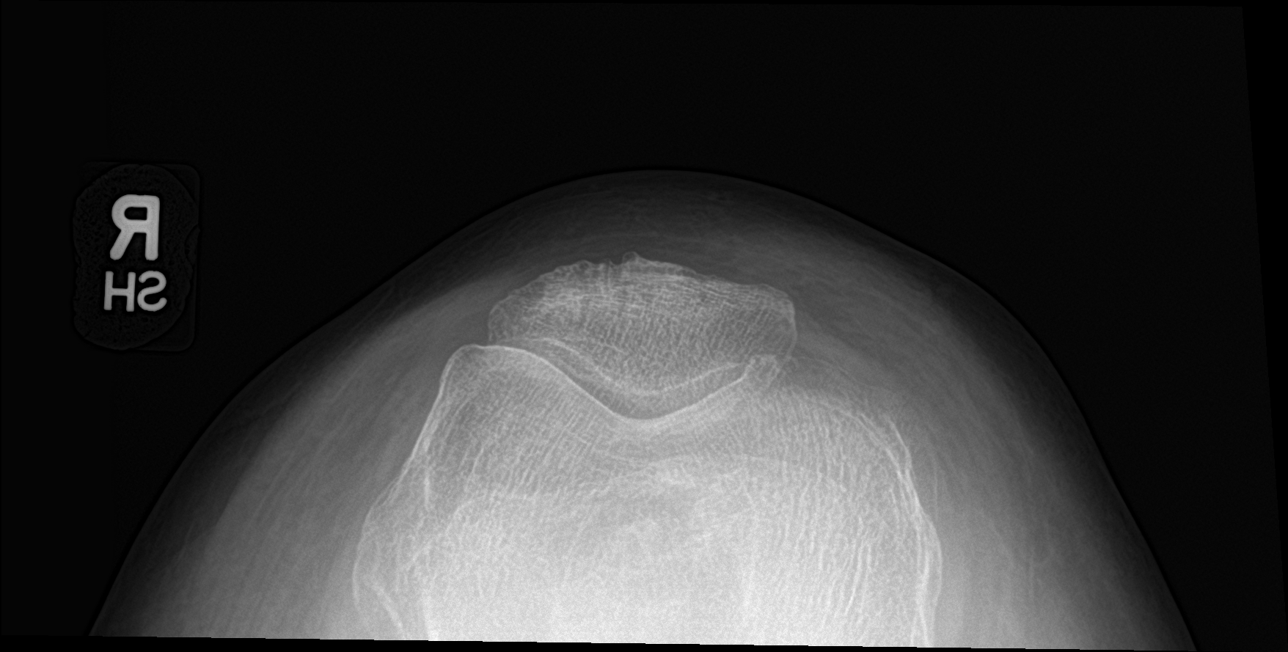

[knee ap bilat standing (2 of 2)]
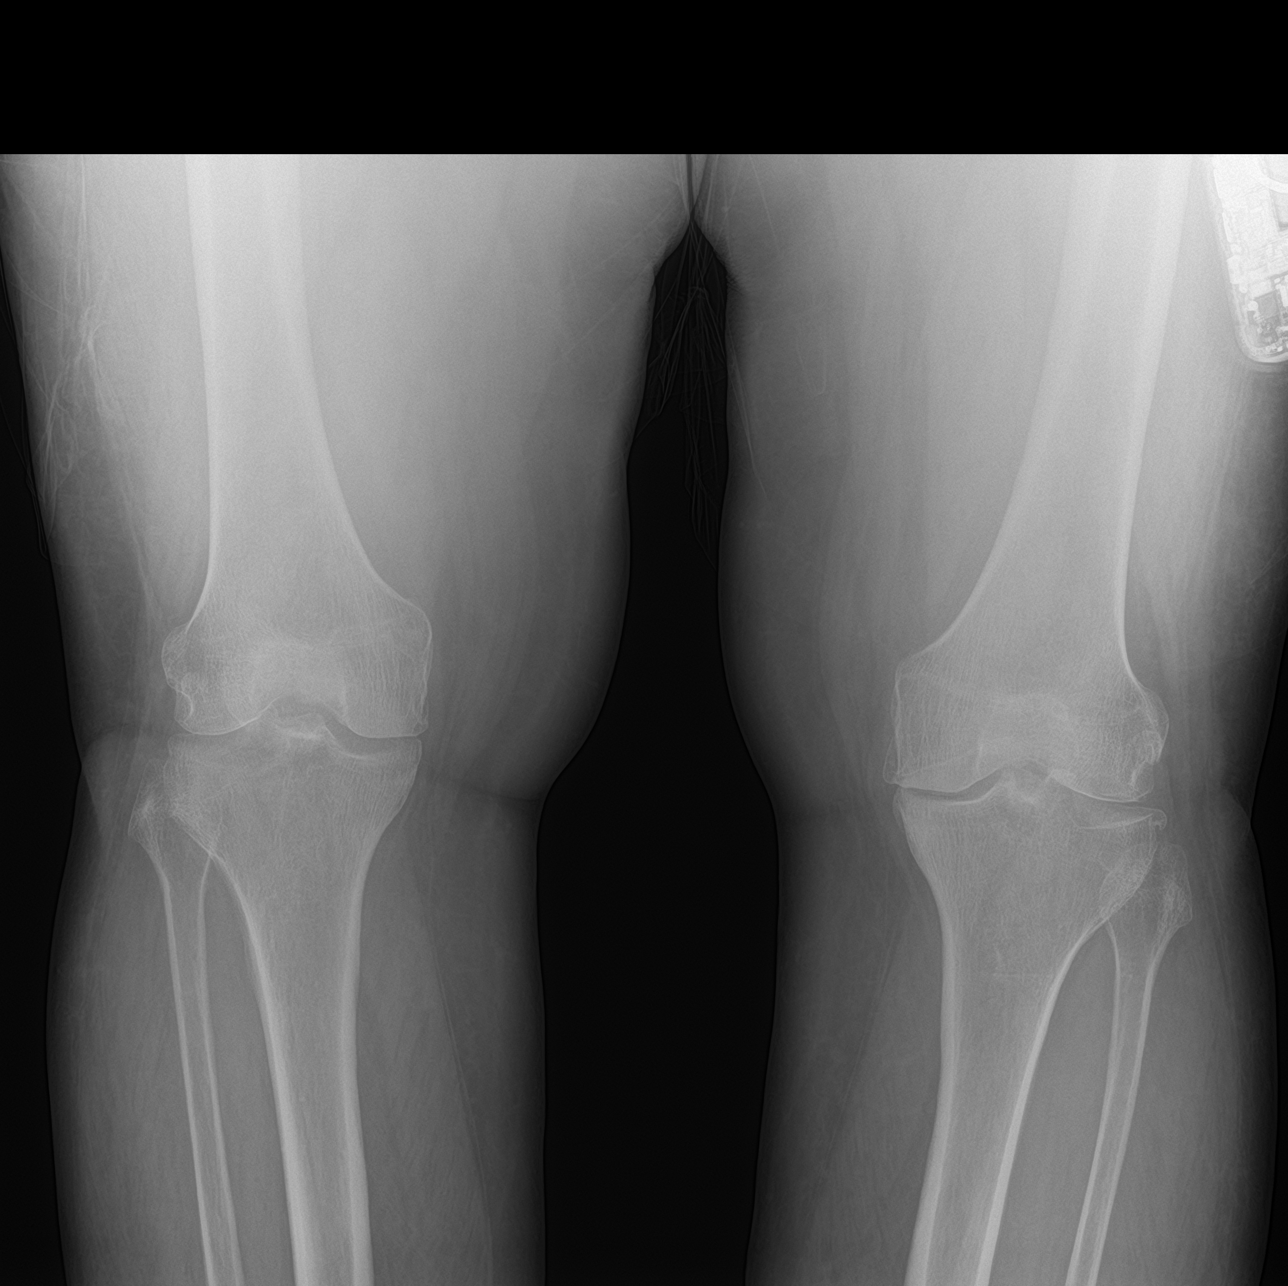

[4 of 4 positions shown; findings below may reference images not displayed]

FINDINGS: No evidence of fracture, dislocation. Minimal right suprapatellar
effusion is noted. Narrow bilateral femoral tibial joint spaces are
identified. Soft tissues are unremarkable.
IMPRESSION: Moderate to severe degenerative joint changes of bilateral knees. No
acute fracture or dislocation noted.

## 2021-12-29 IMAGING — DX DG KNEE COMPLETE 4+V*R*
4 series · 4 of 4 positions shown · non-contrast
Comparison: None.

CLINICAL DATA: Right knee pain for 1 month.

EXAM:
RIGHT KNEE - COMPLETE 4+ VIEW; LEFT KNEE - 2 VIEW

[knee lat]
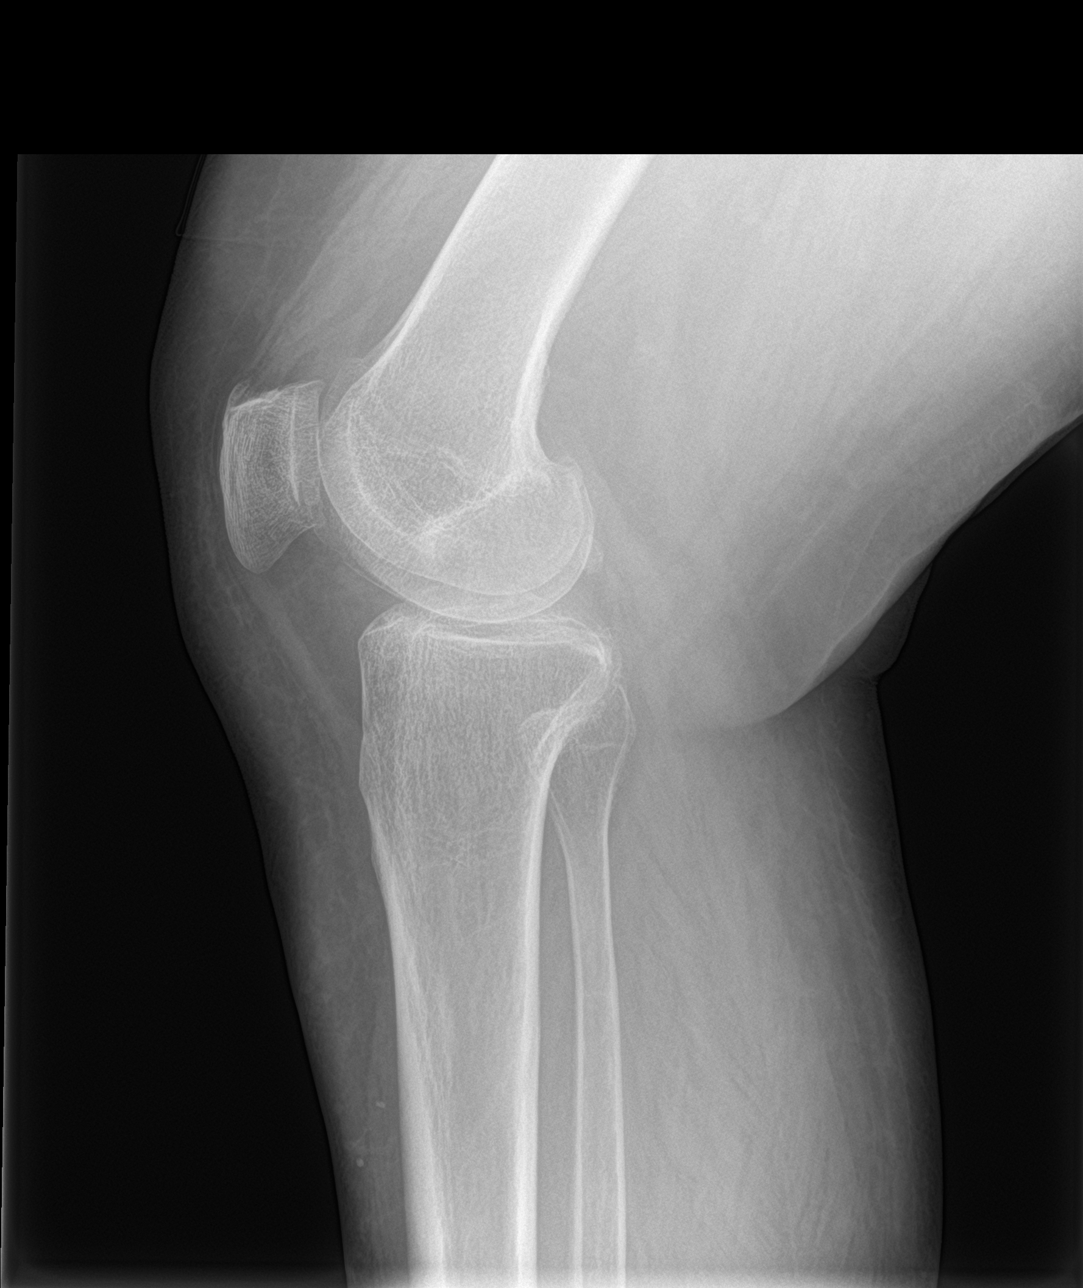

[knee ap bilat standing (1 of 2)]
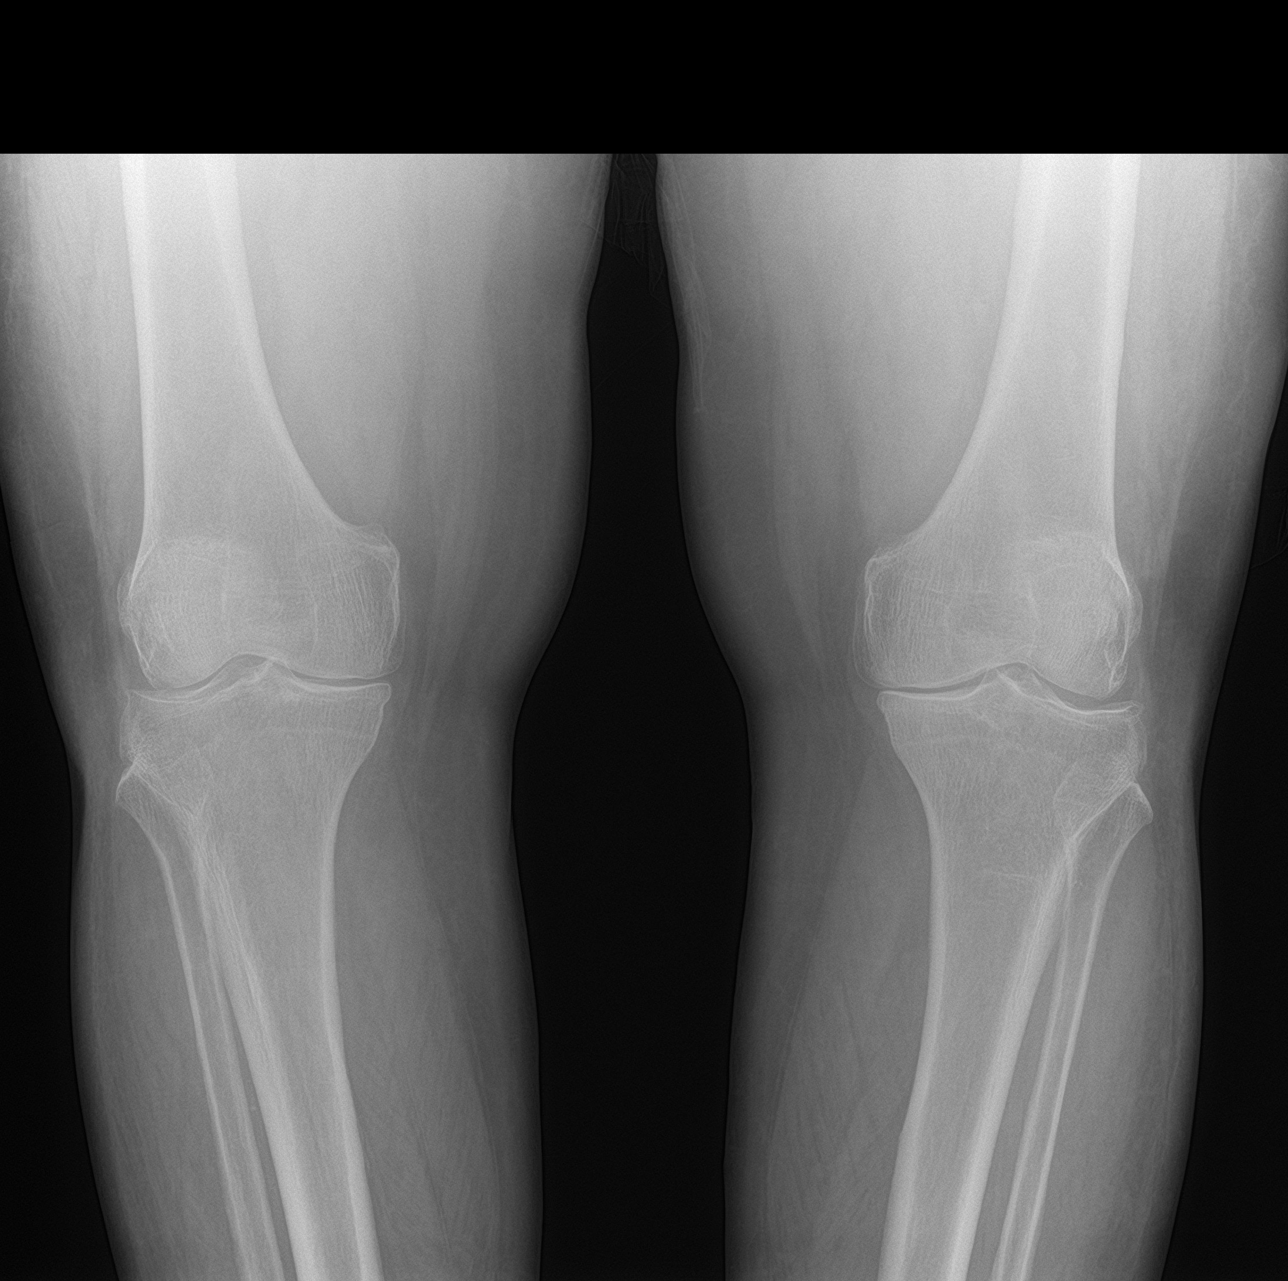

[knee sunrise standing]
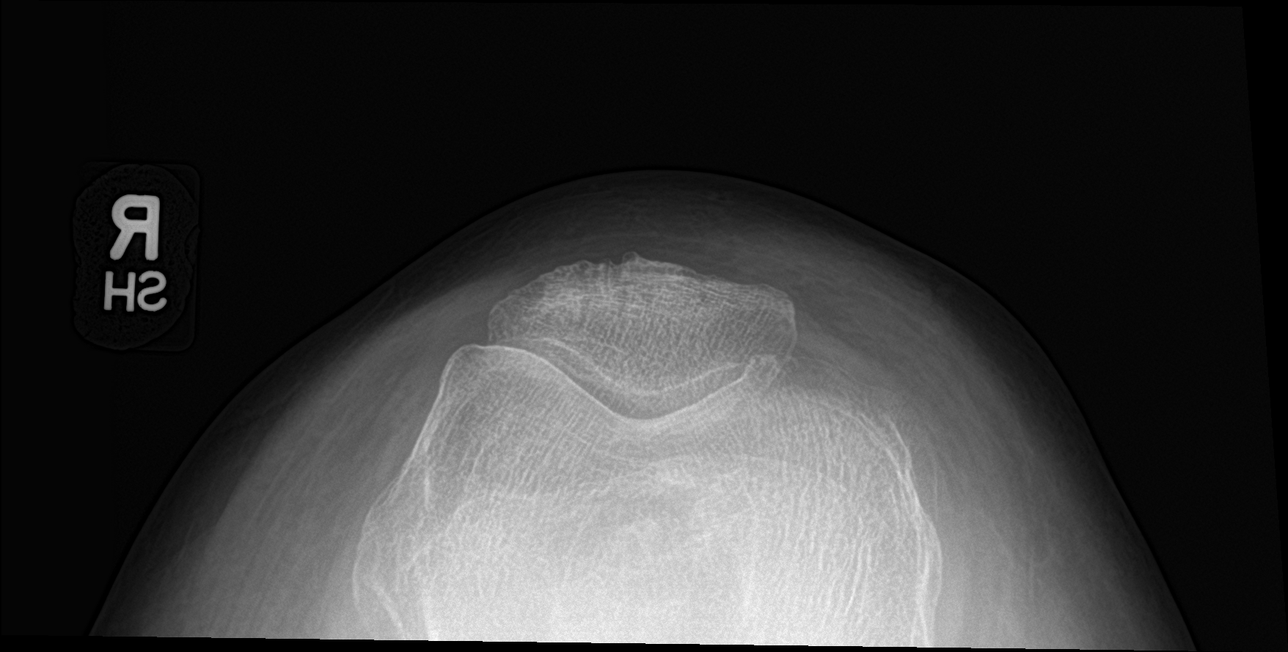

[knee ap bilat standing (2 of 2)]
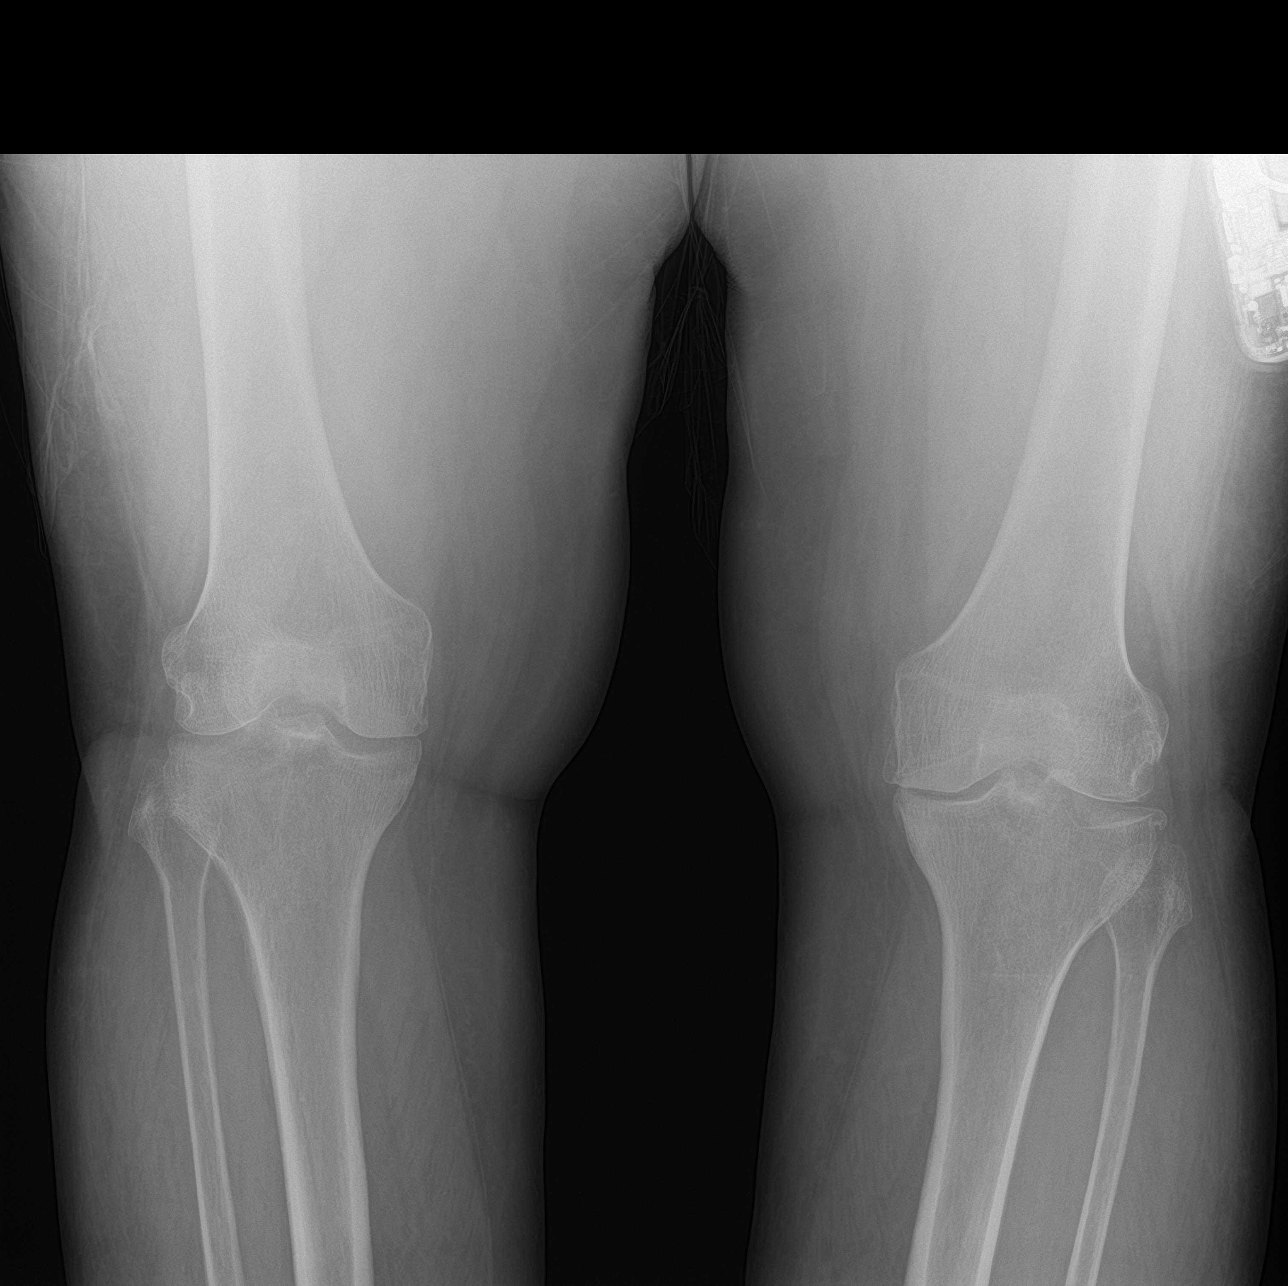

[4 of 4 positions shown; findings below may reference images not displayed]

FINDINGS: No evidence of fracture, dislocation. Minimal right suprapatellar
effusion is noted. Narrow bilateral femoral tibial joint spaces are
identified. Soft tissues are unremarkable.
IMPRESSION: Moderate to severe degenerative joint changes of bilateral knees. No
acute fracture or dislocation noted.

## 2021-12-29 NOTE — Assessment & Plan Note (Signed)
Multiple pathologic processes in the foot, she has had multiple interventions in the past including ankle arthroscopy for an talar dome osteochondral defect, ultimately MRI did show multiple pathologic findings including severe tibiotalar osteoarthritis, as well as severe arthritis of the talonavicular and calcaneocuboid joints, stress injury of the third metatarsal. ?Back in February we injected her tibiotalar joint, she reports 100% improvement, still had some pain at the site of the stress fracture and probably her subtalar joint osteoarthritis, we kept her in the boot and she returns today doing a lot better with only a bit of pain after a long day at work. ?We will leave her foot alone for now. ?

## 2021-12-29 NOTE — Progress Notes (Signed)
? ? ?  Procedures performed today:   ? ?Procedure: Real-time Ultrasound Guided injection of the right knee ?Device: Samsung HS60  ?Verbal informed consent obtained.  ?Time-out conducted.  ?Noted no overlying erythema, induration, or other signs of local infection.  ?Skin prepped in a sterile fashion.  ?Local anesthesia: Topical Ethyl chloride.  ?With sterile technique and under real time ultrasound guidance: Moderate effusion noted, 1 cc Kenalog 40, 2 cc lidocaine, 2 cc bupivacaine injected easily ?Completed without difficulty  ?Advised to call if fevers/chills, erythema, induration, drainage, or persistent bleeding.  ?Images permanently stored and available for review in PACS.  ?Impression: Technically successful ultrasound guided injection. ? ?Independent interpretation of notes and tests performed by another provider:  ? ?None. ? ?Brief History, Exam, Impression, and Recommendations:   ? ?Primary osteoarthritis of right knee ?Pleasant 56 year old female, morbid obesity, severe chronic right knee pain, x-rays back in 2020 at an outside facility showed moderate osteoarthritis, today she has mild swelling, medial joint line tenderness, varus deformity, we will inject her knee today, getting some updated x-rays, return to see me in 6 weeks for this. ? 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 ?Back in February we injected her tibiotalar joint, she reports 100% improvement, still had some pain at the site of the stress fracture and probably her subtalar joint osteoarthritis, we kept her in the boot and she returns today doing a lot better with only a bit of pain after a  long day at work. ?We will leave her foot alone for now. ? ? ? ?___________________________________________ ?Ihor Austin. Benjamin Stain, M.D., ABFM., CAQSM. ?Primary Care and Sports Medicine ?[facility] ? ?Adjunct Instructor of Family Medicine  ?[employer] ?

## 2021-12-29 NOTE — Assessment & Plan Note (Signed)
Pleasant 56 year old female, morbid obesity, severe chronic right knee pain, x-rays back in 2020 at an outside facility showed moderate osteoarthritis, today she has mild swelling, medial joint line tenderness, varus deformity, we will inject her knee today, getting some updated x-rays, return to see me in 6 weeks for this. ?

## 2022-01-01 ENCOUNTER — Telehealth: Payer: Self-pay

## 2022-01-01 NOTE — Telephone Encounter (Signed)
Please see result note, severe arthritis as expected. ?

## 2022-01-01 NOTE — Telephone Encounter (Signed)
Msg sent to Krista Davenport to please call patient as she speaks [place].  ?

## 2022-01-01 NOTE — Telephone Encounter (Signed)
Patient wants to know the results of her x-ray.  ?

## 2022-01-02 NOTE — Telephone Encounter (Signed)
Task was completed on 01/01/22. Patient voiced understanding the results and plan of care. Patient has confirmed to keep her upcoming appointment in June. She wanted provider to be aware that the injection done at the last visit has provided her with some relief. No other inquiries during the call.  ?

## 2022-01-08 ENCOUNTER — Telehealth: Payer: Self-pay

## 2022-01-08 DIAGNOSIS — M1711 Unilateral primary osteoarthritis, right knee: Secondary | ICD-10-CM

## 2022-01-08 NOTE — Telephone Encounter (Signed)
Daughter, Jamelle Haring, called requesting to proceed with the MRI as patient is still in extreme pain and is unable to sleep.  ?

## 2022-01-08 NOTE — Telephone Encounter (Signed)
MRI knee ordered per patient request, she should minimize weightbearing and potentially get some crutches.  Considering the lack of 6 weeks of conservative treatment she may need to pay for it out-of-pocket. ?

## 2022-01-08 NOTE — Telephone Encounter (Signed)
Due to language barrier, daughter aware of Dr. Karie Schwalbe orders and recommendations.  ?

## 2022-01-13 ENCOUNTER — Ambulatory Visit (INDEPENDENT_AMBULATORY_CARE_PROVIDER_SITE_OTHER): Payer: BC Managed Care – PPO

## 2022-01-13 DIAGNOSIS — M1711 Unilateral primary osteoarthritis, right knee: Secondary | ICD-10-CM | POA: Diagnosis not present

## 2022-01-13 IMAGING — MR MR KNEE*R* W/O CM
7 series · 40 of 40 positions shown · non-contrast
Comparison: X-ray [DATE]

CLINICAL DATA: Meniscal injury, knee Persistent medial joint line
pain

EXAM:
MRI OF THE RIGHT KNEE WITHOUT CONTRAST
TECHNIQUE: Multiplanar, multisequence MR imaging of the knee was performed. No
intravenous contrast was administered.

[Series 3: T2 fat-sat · axial · 4.0mm · 0.62mm/px · z∈[-97,+73]mm · 7 of 35 slices shown (1 of 3)]
[im 1/35]
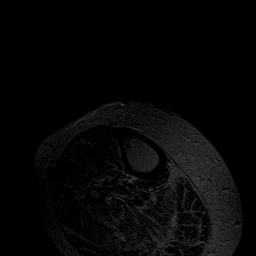
[im 6/35]
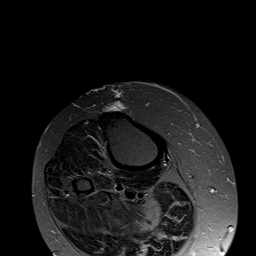
[im 12/35]
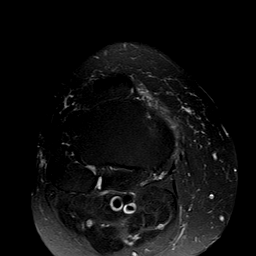
[im 18/35]
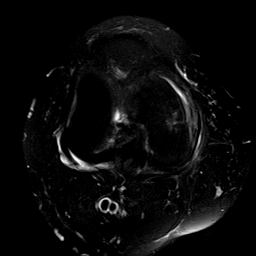
[im 23/35]
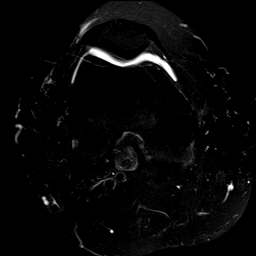
[im 29/35]
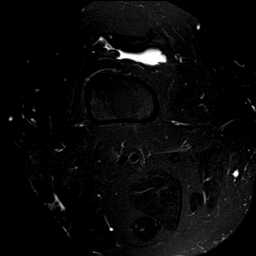
[im 35/35]
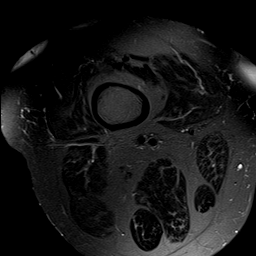

[Series 4: T1 · coronal · 4.0mm · 0.59mm/px · 6 of 33 slices shown]
[im 1/33]
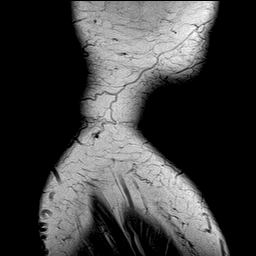
[im 7/33]
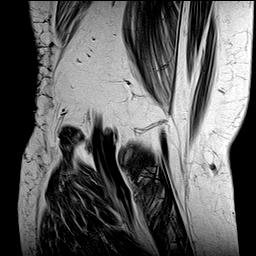
[im 13/33]
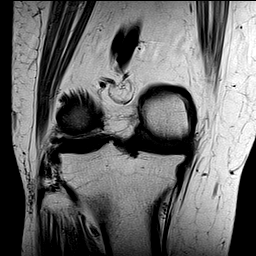
[im 20/33]
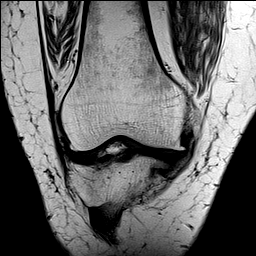
[im 26/33]
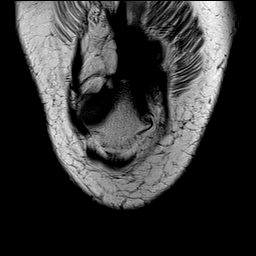
[im 33/33]
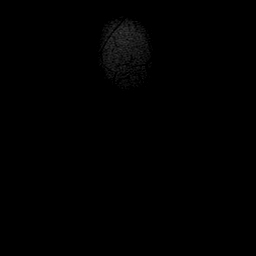

[Series 5: T2 fat-sat · coronal · 4.0mm · 0.59mm/px · 6 of 33 slices shown (2 of 3)]
[im 1/33]
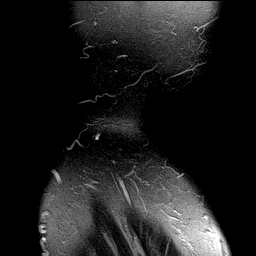
[im 7/33]
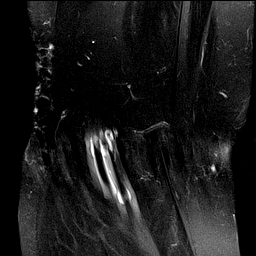
[im 13/33]
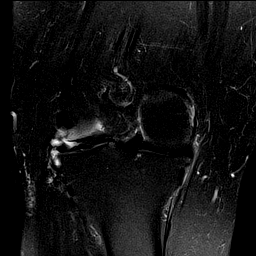
[im 20/33]
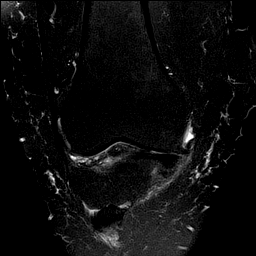
[im 26/33]
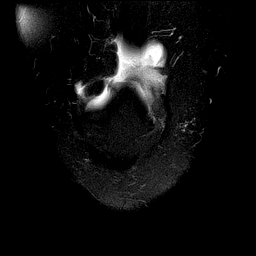
[im 33/33]
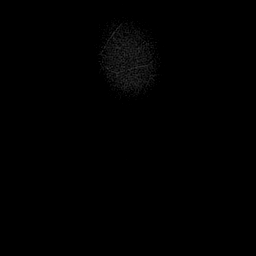

[Series 6: PD fat-sat · coronal · 4.0mm · 0.59mm/px · 6 of 33 slices shown (1 of 3)]
[im 1/33]
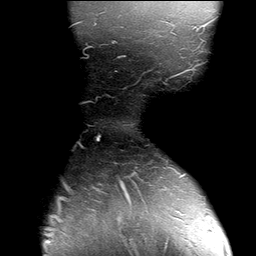
[im 7/33]
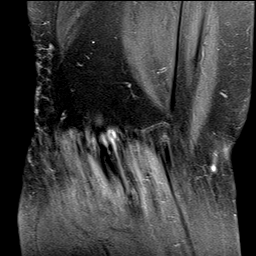
[im 13/33]
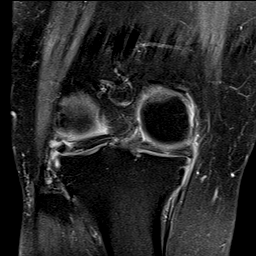
[im 20/33]
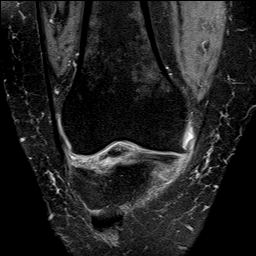
[im 26/33]
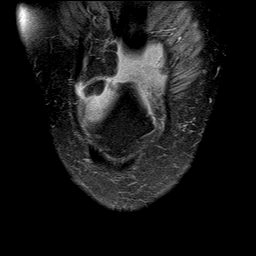
[im 33/33]
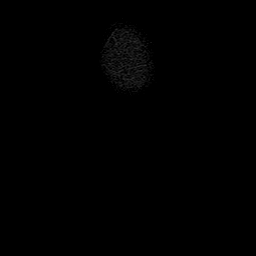

[Series 7: PD fat-sat · sagittal · 3.0mm · 0.59mm/px · 6 of 35 slices shown (2 of 3)]
[im 1/35]
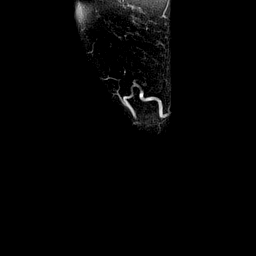
[im 7/35]
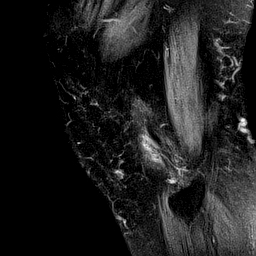
[im 14/35]
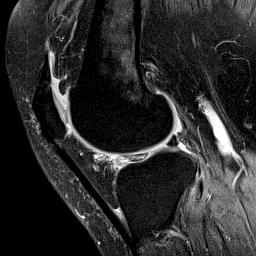
[im 21/35]
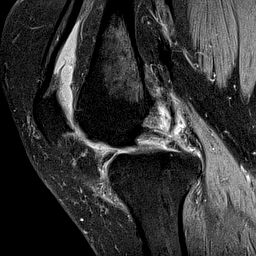
[im 28/35]
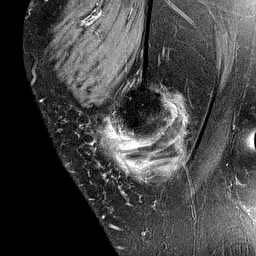
[im 35/35]
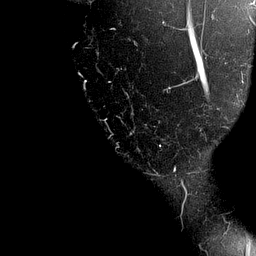

[Series 8: T2 fat-sat · sagittal · 3.0mm · 0.59mm/px · 6 of 35 slices shown (3 of 3)]
[im 1/35]
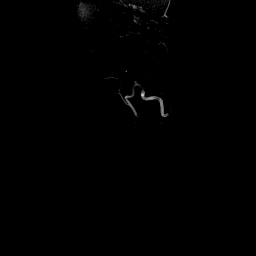
[im 7/35]
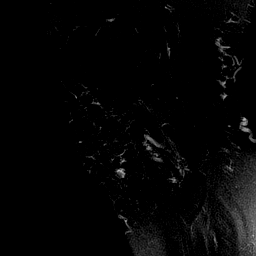
[im 14/35]
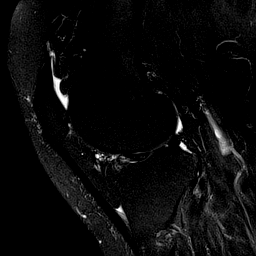
[im 21/35]
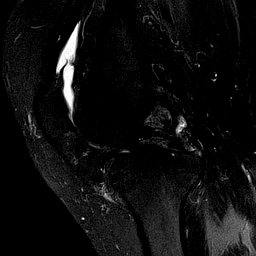
[im 28/35]
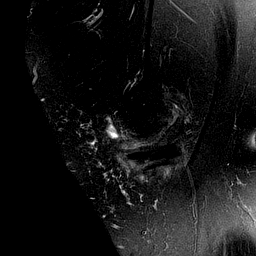
[im 35/35]
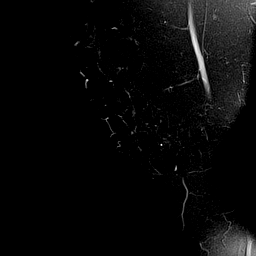

[Series 9: PD fat-sat · coronal · 2.0mm · 0.59mm/px · 3 of 19 slices shown (3 of 3)]
[im 1/19]
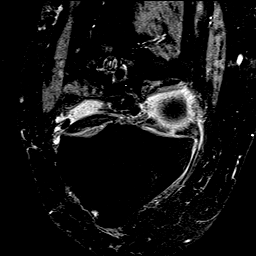
[im 10/19]
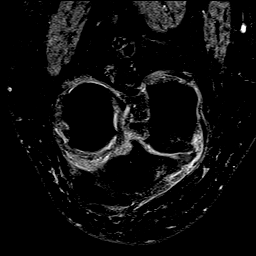
[im 19/19]
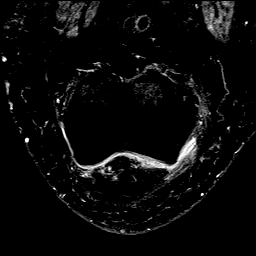

[40 of 40 positions shown; findings below may reference images not displayed]

FINDINGS: MENISCI

Medial meniscus: Advanced intrasubstance degeneration with extensive
free edge and undersurface fraying, most pronounced at the mid body.

Lateral meniscus: Complex tearing of the lateral meniscal anterior
horn with intrameniscal and parameniscal cyst formation with small
multiloculated cyst extending into Hoffa's fat measuring 19 x 10 x
16 mm.

LIGAMENTS

Cruciates: Intact ACL and PCL.

Collaterals: Intact MCL which is medially bowed by medial
compartment osteophytes. Mild periligamentous edema and trace MCL
bursal fluid. Lateral collateral ligament complex intact.

CARTILAGE

Patellofemoral: Focal full-thickness cartilage fissure of the
patellar apex (series 3, image 15). Chondral surface irregularity
within the trochlear groove.

Medial: Moderate-severe chondral thinning and surface irregularity
of the weight-bearing medial compartment with areas of
full-thickness chondral loss.

Lateral:  Mild chondral thinning without focal defect.

MISCELLANEOUS

Joint:  Small joint effusion. Fat pads within normal limits.

Popliteal Fossa:  No Baker's cyst. Intact popliteus tendon.

Extensor Mechanism: Intact quadriceps and patellar tendons. Mild
distal patellar tendinosis.

Bones: No acute fracture. No dislocation. Medial compartment joint
space narrowing with marginal osteophyte formation. Reactive
subchondral marrow signal changes at the periphery of the medial
compartment. No marrow replacing bone lesion.

Other: Generalized subcutaneous edema.
IMPRESSION: 1. Tricompartmental osteoarthritis, moderate to severe within the
medial compartment.
2. Degenerative tearing of the medial meniscus.
3. Complex tearing of the lateral meniscal anterior horn with
intrameniscal and parameniscal cyst formation.
4. Small joint effusion.
5. Mild distal patellar tendinosis.

## 2022-01-15 ENCOUNTER — Ambulatory Visit: Payer: BC Managed Care – PPO | Admitting: Sports Medicine

## 2022-01-15 DIAGNOSIS — M1711 Unilateral primary osteoarthritis, right knee: Secondary | ICD-10-CM | POA: Diagnosis not present

## 2022-01-15 DIAGNOSIS — G8929 Other chronic pain: Secondary | ICD-10-CM

## 2022-01-15 DIAGNOSIS — M79671 Pain in right foot: Secondary | ICD-10-CM | POA: Diagnosis not present

## 2022-01-15 MED ORDER — HYDROCODONE-ACETAMINOPHEN 5-325 MG PO TABS: 1.0000 | ORAL_TABLET | Freq: Three times a day (TID) | ORAL | 0 refills | Status: DC | PRN

## 2022-01-15 NOTE — Assessment & Plan Note (Addendum)
Pleasant 56 year old female, morbid obesity, severe chronic knee pain, osteoarthritis, severe on x-rays and an MRI now that shows severe osteoarthritis as well as the expected intra-articular derangement. ?An injection at the last visit did not help more than a few hours. ?At this point she is a candidate for arthroplasty considering the severity of her arthritis as well as varus deformity. ?She declines referral to a surgeon, we will add a short course of pain medication, knee brace, and a work note. ?Return to see me as needed, further refills of her pain medication need to come from either a pain management provider or her PCP as I have nothing left to offer. ? ?Update: We revised the patient's work note and she is interested in knee arthroplasty, I will place a referral to [facility] orthopedic surgery in [place]. ? ?

## 2022-01-15 NOTE — Progress Notes (Addendum)
? ? ?  Procedures performed today:   ? ?None. ? ?Independent interpretation of notes and tests performed by another provider:  ? ?None. ? ?Brief History, Exam, Impression, and Recommendations:   ? ?Primary osteoarthritis of right knee ?Pleasant 56 year old female, morbid obesity, severe chronic knee pain, osteoarthritis, severe on x-rays and an MRI now that shows severe osteoarthritis as well as the expected intra-articular derangement. ?An injection at the last visit did not help more than a few hours. ?At this point she is a candidate for arthroplasty considering the severity of her arthritis as well as varus deformity. ?She declines referral to a surgeon, we will add a short course of pain medication, knee brace, and a work note. ?Return to see me as needed, further refills of her pain medication need to come from either a pain management provider or her PCP as I have nothing left to offer. ? ?Update: We revised the patient's work note and she is interested in knee arthroplasty, I will place a referral to [facility] orthopedic surgery in [place]. ? ? ?Chronic process with exacerbation and pharmacologic intervention ? ?___________________________________________ ?Ihor Austin. Benjamin Stain, M.D., ABFM., CAQSM. ?Primary Care and Sports Medicine ? [facility] ? ?Adjunct Instructor of Family Medicine  ?[employer] ?

## 2022-01-16 ENCOUNTER — Encounter: Payer: Self-pay | Admitting: Sports Medicine

## 2022-01-16 NOTE — Addendum Note (Signed)
Addended by: Akshat Minehart J on: 01/16/2022 10:05 AM ? ? Modules accepted: Orders ? ?

## 2022-02-08 NOTE — Progress Notes (Signed)
ERROR

## 2022-02-09 ENCOUNTER — Ambulatory Visit: Payer: BC Managed Care – PPO | Admitting: Sports Medicine

## 2022-02-16 ENCOUNTER — Ambulatory Visit: Payer: BC Managed Care – PPO | Admitting: Sports Medicine

## 2022-02-16 DIAGNOSIS — R200 Anesthesia of skin: Secondary | ICD-10-CM | POA: Insufficient documentation

## 2022-02-16 DIAGNOSIS — M1711 Unilateral primary osteoarthritis, right knee: Secondary | ICD-10-CM

## 2022-02-16 NOTE — Progress Notes (Signed)
    Procedures performed today:    None.  Independent interpretation of notes and tests performed by another provider:   None.  Brief History, Exam, Impression, and Recommendations:    Primary osteoarthritis of right knee Morbidly obese 55 year old female, severe osteoarthritis, injections not helpful, getting moderate relief from Tylenol and Aleve. We did set her up with an orthopedic surgeon who suggested to see needed to more conservative treatment and was also too heavy at this juncture for knee arthroplasty.   She would need to lose upwards of 60 pounds to be a candidate.  She was asking about additional FMLA paperwork, she will just need to get it sent here and we will fill it out together.  Numbness of right lateral toes 1 week of numbness right lateral toes. No trauma. X-rays from a year ago at [facility] did show multilevel lumbar DDD. Explained this was likely radicular, adding physical therapy with [facility] in [place] for her back as well. If insufficient improvement after 6 weeks we will proceed with MRI for epidural planning.  I spent 30 minutes of total time managing this patient today, this includes chart review, face to face, and non-face to face time.  ___________________________________________ Ihor Austin. Benjamin Stain, M.D., ABFM., CAQSM. Primary Care and Sports Medicine [facility]  Adjunct Instructor of Family Medicine  [employer]

## 2022-02-16 NOTE — Assessment & Plan Note (Signed)
1 week of numbness right lateral toes. No trauma. X-rays from a year ago at [facility] did show multilevel lumbar DDD. Explained this was likely radicular, adding physical therapy with [facility] in [place] for her back as well. If insufficient improvement after 6 weeks we will proceed with MRI for epidural planning.

## 2022-02-16 NOTE — Assessment & Plan Note (Addendum)
Morbidly obese 56 year old female, severe osteoarthritis, injections not helpful, getting moderate relief from Tylenol and Aleve. We did set her up with an orthopedic surgeon who suggested to see needed to more conservative treatment and was also too heavy at this juncture for knee arthroplasty.   She would need to lose upwards of 60 pounds to be a candidate.  She was asking about additional FMLA paperwork, she will just need to get it sent here and we will fill it out together.

## 2022-02-26 ENCOUNTER — Telehealth: Payer: Self-pay

## 2022-02-26 NOTE — Telephone Encounter (Signed)
Patient dropped off FMLA forms to be completed, she stated she needed them completed by June 24th & she has an appointment scheduled for June 28th. Usually FMLA forms require an appointment but Dr T is booked solid until June 28th, the date of her appointment. Spoke with Christal & let her know what was going on. Placed a billing sheet with forms in Dr Melvia Heaps box. AMUCK *02/26/22

## 2022-02-26 NOTE — Telephone Encounter (Signed)
FMLA paperwork filled out today, a copy will be sent for scanning and fax and patient can come pick up the originals.

## 2022-02-27 NOTE — Telephone Encounter (Signed)
Patient notified paperwork has been completed & sent to fax & original is ready for patient pick up. AMUCK

## 2022-03-07 ENCOUNTER — Ambulatory Visit: Payer: BC Managed Care – PPO | Admitting: Sports Medicine

## 2022-03-07 ENCOUNTER — Telehealth: Payer: Self-pay | Admitting: Sports Medicine

## 2022-03-07 DIAGNOSIS — M1711 Unilateral primary osteoarthritis, right knee: Secondary | ICD-10-CM | POA: Diagnosis not present

## 2022-03-07 NOTE — Telephone Encounter (Signed)
[employer] approval for right knee osteoarthritis, present for greater than 6 weeks, in spite of conservative treatment, injections.

## 2022-03-07 NOTE — Progress Notes (Signed)
    Procedures performed today:    None.  Independent interpretation of notes and tests performed by another provider:   None.  Brief History, Exam, Impression, and Recommendations:    Primary osteoarthritis of right knee 56 year old female returns, severe osteoarthritis, injections not helpful, she has gotten some relief from formal physical therapy. Tylenol, Aleve also helpful. She did have an orthopedic surgery visit, PT was suggested, more conservative treatment followed by arthroplasty however she did need to lose about 60 pounds prior to being a candidate for arthroplasty. At this point she feels a little better, and is ready to go back to work, we will place her back to work on July 17 without restrictions per her request. I did also bring up viscosupplementation if she is interested. We will work on getting her approved.    ____________________________________________ Ihor Austin. Benjamin Stain, M.D., ABFM., CAQSM., AME. Primary Care and Sports Medicine [facility]  Adjunct Professor of Family Medicine  [employer]  Restaurant manager, fast food

## 2022-03-07 NOTE — Assessment & Plan Note (Signed)
56 year old female returns, severe osteoarthritis, injections not helpful, she has gotten some relief from formal physical therapy. Tylenol, Aleve also helpful. She did have an orthopedic surgery visit, PT was suggested, more conservative treatment followed by arthroplasty however she did need to lose about 60 pounds prior to being a candidate for arthroplasty. At this point she feels a little better, and is ready to go back to work, we will place her back to work on July 17 without restrictions per her request. I did also bring up viscosupplementation if she is interested. We will work on getting her approved.

## 2022-03-08 NOTE — Telephone Encounter (Signed)
[employer] paperwork faxed to [employer] at [phone] Request is for Orthovisc Pt's insurance prefers Orthovisc Fax confirmation receipt received  

## 2022-03-15 NOTE — Telephone Encounter (Signed)
Benefits Investigation Details received from [employer] Injection: Orthovisc  Medical: Deductible does not apply. Once the OOP has been met, patient is covered at 100%. Only one copay applies per date of service. PA required: No  Pharmacy: Product is not covered under the pharmacy plan  Specialty Pharmacy: [facility]  May fill through: [place] Copay/Coinsurance:  Product Copay: 0% Administration Coinsurance:  Administration Copay: $35 Deductible:  Out of Pocket Max: $3500 (met: $1147.85)    We have the orthovisc in stock here in the office. It would be once weekly injections for 4 weeks with a $35 copay each week.

## 2022-03-15 NOTE — Telephone Encounter (Signed)
Attempted to contact patient regarding Orthovisc injections, patient's daughter took the call. Patient was unavailable at the time of the call. Patient's daughter was informed of patient's insurance deductible/weekly co-payment responsibility. Patient has an upcoming appointment schedule for 03/30/22. No other inquiries during the call.

## 2022-03-30 ENCOUNTER — Ambulatory Visit: Payer: BC Managed Care – PPO | Admitting: Sports Medicine

## 2022-03-30 ENCOUNTER — Ambulatory Visit (INDEPENDENT_AMBULATORY_CARE_PROVIDER_SITE_OTHER): Payer: BC Managed Care – PPO

## 2022-03-30 DIAGNOSIS — G8929 Other chronic pain: Secondary | ICD-10-CM

## 2022-03-30 DIAGNOSIS — M79671 Pain in right foot: Secondary | ICD-10-CM | POA: Diagnosis not present

## 2022-03-30 DIAGNOSIS — M1711 Unilateral primary osteoarthritis, right knee: Secondary | ICD-10-CM | POA: Diagnosis not present

## 2022-03-30 NOTE — Progress Notes (Signed)
    Procedures performed today:    Procedure: Real-time Ultrasound Guided injection of the right ankle Device: Samsung HS60  Verbal informed consent obtained.  Time-out conducted.  Noted no overlying erythema, induration, or other signs of local infection.  Skin prepped in a sterile fashion.  Local anesthesia: Topical Ethyl chloride.  With sterile technique and under real time ultrasound guidance: Noted arthritic joint, 1 cc Kenalog 40, 1 cc lidocaine, 1 cc bupivacaine injected easily Completed without difficulty  Advised to call if fevers/chills, erythema, induration, drainage, or persistent bleeding.  Images permanently stored and available for review in PACS.  Impression: Technically successful ultrasound guided injection.  Independent interpretation of notes and tests performed by another provider:   None.  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 Back in February we injected her tibiotalar joint and she had excellent relief. Now having a recurrence of pain so we will repeat a tibiotalar joint injection on the right.  It sounds like she is looking for full Social Security disability, I did inform her that this is not something that we certify here and she would need to talk to her primary care provider. I do think she can work a sedentary job.   Primary osteoarthritis of right knee Severe knee osteoarthritis, steroid injections not helpful, Tylenol, Aleve, formal PT only minimally helpful. She does need a knee arthroplasty but she is too obese, she needs to lose about 60 pounds before being a candidate for knee arthroplasty. We did get her approved for  viscosupplementation, so we can bring her back at a follow-up visit to start these injections.   ____________________________________________ Ihor Austin. Benjamin Stain, M.D., ABFM., CAQSM., AME. Primary Care and Sports Medicine [facility]  Adjunct Professor of Family Medicine  [employer] [employer]  Restaurant manager, fast food

## 2022-03-30 NOTE — Assessment & Plan Note (Signed)
Severe knee osteoarthritis, steroid injections not helpful, Tylenol, Aleve, formal PT only minimally helpful. She does need a knee arthroplasty but she is too obese, she needs to lose about 60 pounds before being a candidate for knee arthroplasty. We did get her approved for viscosupplementation, so we can bring her back at a follow-up visit to start these injections.

## 2022-03-30 NOTE — Assessment & Plan Note (Addendum)
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 Back in February we injected her tibiotalar joint and she had excellent relief. Now having a recurrence of pain so we will repeat a tibiotalar joint injection on the right.  It sounds like she is looking for full Social Security disability, I did inform her that this is not something that we certify here and she would need to talk to her primary care provider. I do think she can work a sedentary job.

## 2022-04-13 ENCOUNTER — Ambulatory Visit: Payer: BC Managed Care – PPO | Admitting: Sports Medicine

## 2022-04-13 ENCOUNTER — Ambulatory Visit (INDEPENDENT_AMBULATORY_CARE_PROVIDER_SITE_OTHER): Payer: BC Managed Care – PPO

## 2022-04-13 DIAGNOSIS — M1711 Unilateral primary osteoarthritis, right knee: Secondary | ICD-10-CM | POA: Diagnosis not present

## 2022-04-13 NOTE — Progress Notes (Signed)
    Procedures performed today:    Procedure: Real-time Ultrasound Guided aspiration/injection of the right knee Device: Samsung HS60  Verbal informed consent obtained.  Time-out conducted.  Noted no overlying erythema, induration, or other signs of local infection.  Skin prepped in a sterile fashion.  Local anesthesia: Topical Ethyl chloride.  With sterile technique and under real time ultrasound guidance: Mild effusion noted, advanced an 18-gauge needle into the suprapatellar recess, aspirated 20 mils of clear, straw-colored fluid, syringe switched and, 30 mg/2 mL of OrthoVisc (sodium hyaluronate) in a prefilled syringe was injected easily into the knee. Completed without difficulty  Advised to call if fevers/chills, erythema, induration, drainage, or persistent bleeding.  Images permanently stored and available for review in PACS.  Impression: Technically successful ultrasound guided injection.  Independent interpretation of notes and tests performed by another provider:   None.  Brief History, Exam, Impression, and Recommendations:    Primary osteoarthritis of right knee Severe knee osteoarthritis, steroid injections not helpful, Tylenol, Aleve, formal PT only minimally helpful, she does need a knee arthroplasty but she is too obese, needs to lose about 60 pounds before being a candidate. She did get approved for viscosupplementation so we will start Orthovisc today, Orthovisc No. 1 of 4 right knee, return in 1 week for #2 of 4.    ____________________________________________ Ihor Austin. Benjamin Stain, M.D., ABFM., CAQSM., AME. Primary Care and Sports Medicine [facility]  Adjunct Professor of Family Medicine  [place] [employer] [facility] [employer] [facility] [employer]  Restaurant manager, fast food

## 2022-04-13 NOTE — Assessment & Plan Note (Signed)
Severe knee osteoarthritis, steroid injections not helpful, Tylenol, Aleve, formal PT only minimally helpful, she does need a knee arthroplasty but she is too obese, needs to lose about 60 pounds before being a candidate. She did get approved for viscosupplementation so we will start Orthovisc today, Orthovisc No. 1 of 4 right knee, return in 1 week for #2 of 4.

## 2022-04-20 ENCOUNTER — Ambulatory Visit (INDEPENDENT_AMBULATORY_CARE_PROVIDER_SITE_OTHER): Payer: BC Managed Care – PPO

## 2022-04-20 ENCOUNTER — Ambulatory Visit: Payer: BC Managed Care – PPO | Admitting: Sports Medicine

## 2022-04-20 DIAGNOSIS — M1711 Unilateral primary osteoarthritis, right knee: Secondary | ICD-10-CM | POA: Diagnosis not present

## 2022-04-20 DIAGNOSIS — M170 Bilateral primary osteoarthritis of knee: Secondary | ICD-10-CM

## 2022-04-20 NOTE — Assessment & Plan Note (Addendum)
56 year old female returns, severe bilateral knee arthritis, we did a right knee aspiration and Orthovisc No. 1 at the last visit, today we did Orthovisc No. 2 of 4 right knee, return in 1 week for #3 of 4 right knee. She would also like to start Orthovisc in the left side, we will work on approval.

## 2022-04-20 NOTE — Telephone Encounter (Signed)
Patient also requesting Orthovisc for the left knee.  Please work on approval.

## 2022-04-20 NOTE — Progress Notes (Signed)
    Procedures performed today:    Procedure: Real-time Ultrasound Guided aspiration/injection of the right knee Device: Samsung HS60  Verbal informed consent obtained.  Time-out conducted.  Noted no overlying erythema, induration, or other signs of local infection.  Skin prepped in a sterile fashion.  Local anesthesia: Topical Ethyl chloride.  With sterile technique and under real time ultrasound guidance: Mild effusion noted, advanced an 18-gauge needle into the suprapatellar recess, aspirated 20 mils of clear, straw-colored fluid, syringe switched and, 30 mg/2 mL of OrthoVisc (sodium hyaluronate) in a prefilled syringe was injected easily into the knee. Completed without difficulty  Advised to call if fevers/chills, erythema, induration, drainage, or persistent bleeding.  Images permanently stored and available for review in PACS.  Impression: Technically successful ultrasound guided injection.  Independent interpretation of notes and tests performed by another provider:   None.  Brief History, Exam, Impression, and Recommendations:    Primary osteoarthritis of both knees 56 year old female returns, severe bilateral knee arthritis, we did a right knee aspiration and Orthovisc No. 1 at the last visit, today we did Orthovisc No. 2 of 4 right knee, return in 1 week for #3 of 4 right knee. She would also like to start Orthovisc in the left side, we will work on approval.    ____________________________________________ Ihor Austin. Benjamin Stain, M.D., ABFM., CAQSM., AME. Primary Care and Sports Medicine [place] [facility]  Adjunct Professor of Family Medicine  [place] [employer] [facility] [employer]  Restaurant manager, fast food

## 2022-04-30 ENCOUNTER — Ambulatory Visit: Payer: BC Managed Care – PPO | Admitting: Sports Medicine

## 2022-04-30 ENCOUNTER — Ambulatory Visit (INDEPENDENT_AMBULATORY_CARE_PROVIDER_SITE_OTHER): Payer: BC Managed Care – PPO

## 2022-04-30 DIAGNOSIS — G8929 Other chronic pain: Secondary | ICD-10-CM

## 2022-04-30 DIAGNOSIS — M79671 Pain in right foot: Secondary | ICD-10-CM | POA: Diagnosis not present

## 2022-04-30 DIAGNOSIS — M170 Bilateral primary osteoarthritis of knee: Secondary | ICD-10-CM | POA: Diagnosis not present

## 2022-04-30 NOTE — Assessment & Plan Note (Signed)
Severe bilateral knee osteoarthritis, Orthovisc No. 3 of 4 today, return in 1 week for #4 of 4, also working on left side approval.

## 2022-04-30 NOTE — Progress Notes (Signed)
    Procedures performed today:    Procedure: Real-time Ultrasound Guided injection of the right knee Device: Samsung HS60  Verbal informed consent obtained.  Time-out conducted.  Noted no overlying erythema, induration, or other signs of local infection.  Skin prepped in a sterile fashion.  Local anesthesia: Topical Ethyl chloride.  With sterile technique and under real time ultrasound guidance: Mild effusion noted, 30 mg/2 mL of OrthoVisc (sodium hyaluronate) in a prefilled syringe was injected easily into the knee through a 22-gauge needle. Completed without difficulty  Advised to call if fevers/chills, erythema, induration, drainage, or persistent bleeding.  Images permanently stored and available for review in PACS.  Impression: Technically successful ultrasound guided injection.  Independent interpretation of notes and tests performed by another provider:   None.  Brief History, Exam, Impression, and Recommendations:    Primary osteoarthritis of both knees Severe bilateral knee osteoarthritis, Orthovisc No. 3 of 4 today, return in 1 week for #4 of 4, also working on left side approval.  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 She had an injection back in July, tibiotalar with ultrasound guidance. Complaining of persistent pain, I advised her she needs to go back to her surgeon for discussion of ankle fusion versus arthroplasty at this point.  She also was looking for full Social Security disability, I did inform her that this is not something that we would certify here and she would need to talk to her PCP, I do think she can work a sedentary job.    ____________________________________________ Ihor Austin. Benjamin Stain, M.D.,  ABFM., CAQSM., AME. Primary Care and Sports Medicine [place] [facility]  Adjunct Professor of Family Medicine  [place] of [facility] [employer]  Restaurant manager, fast food

## 2022-04-30 NOTE — Assessment & Plan Note (Signed)
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 She had an injection back in July, tibiotalar with ultrasound guidance. Complaining of persistent pain, I advised her she needs to go back to her surgeon for discussion of ankle fusion versus arthroplasty at this point.  She also was looking for full Social Security disability, I did inform her that this is not something that we would certify here and she would need to talk to her PCP, I do think she can work a sedentary job.

## 2022-05-04 DIAGNOSIS — N2889 Other specified disorders of kidney and ureter: Secondary | ICD-10-CM | POA: Insufficient documentation

## 2022-05-04 DIAGNOSIS — R0602 Shortness of breath: Secondary | ICD-10-CM | POA: Insufficient documentation

## 2022-05-07 ENCOUNTER — Ambulatory Visit (INDEPENDENT_AMBULATORY_CARE_PROVIDER_SITE_OTHER): Payer: BC Managed Care – PPO

## 2022-05-07 ENCOUNTER — Ambulatory Visit: Payer: BC Managed Care – PPO | Admitting: Sports Medicine

## 2022-05-07 DIAGNOSIS — M170 Bilateral primary osteoarthritis of knee: Secondary | ICD-10-CM

## 2022-05-07 DIAGNOSIS — N2889 Other specified disorders of kidney and ureter: Secondary | ICD-10-CM | POA: Diagnosis not present

## 2022-05-07 NOTE — Assessment & Plan Note (Signed)
Recently the ED for some shortness of breath, diagnosed with a renal mass, wanted to talk about this and weight, not much evaluation and management done but we did review her chart. She understands further management per primary care provider and urology.

## 2022-05-07 NOTE — Progress Notes (Signed)
    Procedures performed today:    Procedure: Real-time Ultrasound Guided injection of the right knee Device: Samsung HS60  Verbal informed consent obtained.  Time-out conducted.  Noted no overlying erythema, induration, or other signs of local infection.  Skin prepped in a sterile fashion.  Local anesthesia: Topical Ethyl chloride.  With sterile technique and under real time ultrasound guidance: Mild effusion noted, 30 mg/2 mL of OrthoVisc (sodium hyaluronate) in a prefilled syringe was injected easily into the knee through a 22-gauge needle. Completed without difficulty  Advised to call if fevers/chills, erythema, induration, drainage, or persistent bleeding.  Images permanently stored and available for review in PACS.  Impression: Technically successful ultrasound guided injection.  Independent interpretation of notes and tests performed by another provider:   None.  Brief History, Exam, Impression, and Recommendations:    Primary osteoarthritis of both knees Severe bilateral knee osteoarthritis, still awaiting left-sided Orthovisc approval, today we did Orthovisc No. 4 of 4 right knee. Started to feel better.  Renal mass Recently the ED for some shortness of breath, diagnosed with a renal mass, wanted to talk about this and weight, not much evaluation and management done but we did review her chart. She understands further management per primary care provider and urology.    ____________________________________________ Ihor Austin. Benjamin Stain, M.D., ABFM., CAQSM., AME. Primary Care and Sports Medicine  [facility]  Adjunct Professor of Family Medicine  [place] of [facility] [employer]  Restaurant manager, fast food

## 2022-05-07 NOTE — Assessment & Plan Note (Signed)
Severe bilateral knee osteoarthritis, still awaiting left-sided Orthovisc approval, today we did Orthovisc No. 4 of 4 right knee. Started to feel better.

## 2022-05-08 NOTE — Telephone Encounter (Signed)
[employer] paperwork faxed to [employer] at [phone] Request is for Orthovisc Pt's insurance prefers Orthovisc Fax confirmation receipt received   For the left knee

## 2022-05-28 ENCOUNTER — Telehealth: Payer: Self-pay | Admitting: Sports Medicine

## 2022-05-28 NOTE — Telephone Encounter (Signed)
FMLA paperwork filled out today. 

## 2022-05-31 NOTE — Telephone Encounter (Signed)
Benefits Investigation Details received from [employer] Injection: Orthovisc  Medical: Deductible does not apply. Once the OOP has been met, pt covered at 100%. Only one copay apples per date of service.  PA required: No  Pharmacy: Product not covered under pharmacy plan.   Specialty Pharmacy: [facility]  May fill through: [place] Copay/Coinsurance: $35 Product Copay:  Administration Coinsurance:  Administration Copay: $35 Deductible: $3500 (met: $1378.56) Out of Pocket Max: $7000 (met: $2291.90)    Patient approx cost per is $70. Due to language barrier, spoke with daughter regarding cost. She will discuss with patient and call back to schedule if they wish to proceed.

## 2022-06-05 DIAGNOSIS — R7303 Prediabetes: Secondary | ICD-10-CM | POA: Insufficient documentation

## 2022-08-13 DIAGNOSIS — J45909 Unspecified asthma, uncomplicated: Secondary | ICD-10-CM | POA: Insufficient documentation

## 2022-08-27 DIAGNOSIS — E559 Vitamin D deficiency, unspecified: Secondary | ICD-10-CM | POA: Insufficient documentation

## 2022-10-07 DIAGNOSIS — Z85528 Personal history of other malignant neoplasm of kidney: Secondary | ICD-10-CM | POA: Insufficient documentation

## 2022-11-08 ENCOUNTER — Ambulatory Visit (INDEPENDENT_AMBULATORY_CARE_PROVIDER_SITE_OTHER): Payer: BC Managed Care – PPO

## 2022-11-08 ENCOUNTER — Ambulatory Visit (INDEPENDENT_AMBULATORY_CARE_PROVIDER_SITE_OTHER): Payer: BC Managed Care – PPO | Admitting: Sports Medicine

## 2022-11-08 DIAGNOSIS — M170 Bilateral primary osteoarthritis of knee: Secondary | ICD-10-CM

## 2022-11-08 MED ORDER — HYALURONAN 30 MG/2ML IX SOSY
30.0000 mg | PREFILLED_SYRINGE | Freq: Once | INTRA_ARTICULAR | Status: AC
  Administered 2022-11-08: 30 mg via INTRA_ARTICULAR

## 2022-11-08 MED ORDER — TRIAMCINOLONE ACETONIDE 40 MG/ML IJ SUSP
40.0000 mg | Freq: Once | INTRAMUSCULAR | Status: AC
  Administered 2022-11-08: 40 mg via INTRAMUSCULAR

## 2022-11-08 NOTE — Addendum Note (Signed)
Addended byAnnamaria Helling on: 11/08/2022 02:09 PM   Modules accepted: Orders

## 2022-11-08 NOTE — Progress Notes (Signed)
    Procedures performed today:    Procedure: Real-time Ultrasound Guided injection of the left knee Device: Samsung HS60  Verbal informed consent obtained.  Time-out conducted.  Noted no overlying erythema, induration, or other signs of local infection.  Skin prepped in a sterile fashion.  Local anesthesia: Topical Ethyl chloride.  With sterile technique and under real time ultrasound guidance: Mild effusion noted, 1 cc Kenalog 40, 2 cc lidocaine, 2 cc bupivacaine injected, syringe switched and 30 mg/2 mL of OrthoVisc (sodium hyaluronate) in a prefilled syringe was injected easily into the knee through a 22-gauge needle.  Completed without difficulty  Advised to call if fevers/chills, erythema, induration, drainage, or persistent bleeding.  Images permanently stored and available for review in PACS.  Impression: Technically successful ultrasound guided injection.  Independent interpretation of notes and tests performed by another provider:   None.  Brief History, Exam, Impression, and Recommendations:    Primary osteoarthritis of both knees Pleasant 57 year old female, bilateral knee osteoarthritis, we did Orthovisc in her right knee in the summer of last year, the right knee continues to do really well. Having increasing pain in the left knee, she is still within the Orthovisc approval window of 1 year so we will do steroid and Orthovisc No. 1 left knee, return in 1 week for Orthovisc No. 2 of 4.    ____________________________________________ Gwen Her. Dianah Field, M.D., ABFM., CAQSM., AME. Primary Care and Sports Medicine [place] [facility]  Adjunct Professor of [place] of [facility] of Medicine  Risk manager

## 2022-11-08 NOTE — Assessment & Plan Note (Signed)
Pleasant 57 year old female, bilateral knee osteoarthritis, we did Orthovisc in her right knee in the summer of last year, the right knee continues to do really well. Having increasing pain in the left knee, she is still within the Orthovisc approval window of 1 year so we will do steroid and Orthovisc No. 1 left knee, return in 1 week for Orthovisc No. 2 of 4.

## 2022-11-15 ENCOUNTER — Ambulatory Visit: Payer: BC Managed Care – PPO | Admitting: Sports Medicine

## 2022-11-15 ENCOUNTER — Ambulatory Visit (INDEPENDENT_AMBULATORY_CARE_PROVIDER_SITE_OTHER): Payer: BC Managed Care – PPO

## 2022-11-15 DIAGNOSIS — M170 Bilateral primary osteoarthritis of knee: Secondary | ICD-10-CM | POA: Diagnosis not present

## 2022-11-15 MED ORDER — HYALURONAN 30 MG/2ML IX SOSY
30.0000 mg | PREFILLED_SYRINGE | Freq: Once | INTRA_ARTICULAR | Status: AC
  Administered 2022-11-15: 30 mg via INTRA_ARTICULAR

## 2022-11-15 NOTE — Progress Notes (Signed)
    Procedures performed today:    Procedure: Real-time Ultrasound Guided injection of the left knee Device: Samsung HS60  Verbal informed consent obtained.  Time-out conducted.  Noted no overlying erythema, induration, or other signs of local infection.  Skin prepped in a sterile fashion.  Local anesthesia: Topical Ethyl chloride.  With sterile technique and under real time ultrasound guidance: Mild effusion noted, 1 cc Kenalog 40, 2 cc lidocaine, 2 cc bupivacaine injected, syringe switched and 30 mg/2 mL of OrthoVisc (sodium hyaluronate) in a prefilled syringe was injected easily into the knee through a 22-gauge needle.  Completed without difficulty  Advised to call if fevers/chills, erythema, induration, drainage, or persistent bleeding.  Images permanently stored and available for review in PACS.  Impression: Technically successful ultrasound guided injection.  Independent interpretation of notes and tests performed by another provider:   None.  Brief History, Exam, Impression, and Recommendations:    Primary osteoarthritis of both knees Right knee continues to do well after a series of Orthovisc done in the summer 2023. We are doing Orthovisc left knee, today we did Orthovisc No. 2 of 4 left knee, return in 1 week for Orthovisc #3 of 4    ____________________________________________ Gwen Her. Dianah Field, M.D., ABFM., CAQSM., AME. Primary Care and Sports Medicine [place] [facility]  Adjunct Professor of [place] of [facility] of Medicine  Risk manager

## 2022-11-15 NOTE — Assessment & Plan Note (Signed)
Right knee continues to do well after a series of Orthovisc done in the summer 2023. We are doing Orthovisc left knee, today we did Orthovisc No. 2 of 4 left knee, return in 1 week for Orthovisc #3 of 4

## 2022-11-15 NOTE — Addendum Note (Signed)
Addended by: Tarri Glenn A on: 11/15/2022 03:23 PM   Modules accepted: Orders

## 2022-11-23 ENCOUNTER — Ambulatory Visit: Payer: BC Managed Care – PPO | Admitting: Sports Medicine

## 2022-11-23 ENCOUNTER — Other Ambulatory Visit (INDEPENDENT_AMBULATORY_CARE_PROVIDER_SITE_OTHER): Payer: BC Managed Care – PPO

## 2022-11-23 DIAGNOSIS — M170 Bilateral primary osteoarthritis of knee: Secondary | ICD-10-CM

## 2022-11-23 MED ORDER — HYALURONAN 30 MG/2ML IX SOSY
30.0000 mg | PREFILLED_SYRINGE | Freq: Once | INTRA_ARTICULAR | Status: AC
  Administered 2022-11-23: 30 mg via INTRA_ARTICULAR

## 2022-11-23 NOTE — Progress Notes (Signed)
    Procedures performed today:    Procedure: Real-time Ultrasound Guided injection of the left knee Device: Samsung HS60  Verbal informed consent obtained.  Time-out conducted.  Noted no overlying erythema, induration, or other signs of local infection.  Skin prepped in a sterile fashion.  Local anesthesia: Topical Ethyl chloride.  With sterile technique and under real time ultrasound guidance: Mild effusion noted, 30 mg/2 mL of OrthoVisc (sodium hyaluronate) in a prefilled syringe was injected easily into the knee through a 22-gauge needle.  Completed without difficulty  Advised to call if fevers/chills, erythema, induration, drainage, or persistent bleeding.  Images permanently stored and available for review in PACS.  Impression: Technically successful ultrasound guided injection.  Independent interpretation of notes and tests performed by another provider:   None.  Brief History, Exam, Impression, and Recommendations:    Primary osteoarthritis of both knees Orthovisc 3 of 4 left knee, return in 1 week #4 of 4    ____________________________________________ Gwen Her. Dianah Field, M.D., ABFM., CAQSM., AME. Primary Care and Sports Medicine  [facility]  Adjunct Professor of [place] of [facility] of Medicine  Risk manager

## 2022-11-23 NOTE — Assessment & Plan Note (Signed)
Orthovisc 3 of 4 left knee, return in 1 week #4 of 4

## 2022-11-30 ENCOUNTER — Other Ambulatory Visit (INDEPENDENT_AMBULATORY_CARE_PROVIDER_SITE_OTHER): Payer: BC Managed Care – PPO

## 2022-11-30 ENCOUNTER — Encounter: Payer: Self-pay | Admitting: Sports Medicine

## 2022-11-30 ENCOUNTER — Ambulatory Visit: Payer: BC Managed Care – PPO | Admitting: Sports Medicine

## 2022-11-30 DIAGNOSIS — M899 Disorder of bone, unspecified: Secondary | ICD-10-CM

## 2022-11-30 DIAGNOSIS — M170 Bilateral primary osteoarthritis of knee: Secondary | ICD-10-CM

## 2022-11-30 DIAGNOSIS — M949 Disorder of cartilage, unspecified: Secondary | ICD-10-CM

## 2022-11-30 NOTE — Assessment & Plan Note (Signed)
Orthovisc No. 4 of 4 left knee, return as needed. 

## 2022-11-30 NOTE — Assessment & Plan Note (Signed)
Doing much better with custom orthotics

## 2022-11-30 NOTE — Progress Notes (Addendum)
    Procedures performed today:    None.  Independent interpretation of notes and tests performed by another provider:   Procedure: Real-time Ultrasound Guided injection of the left knee Device: Samsung HS60  Verbal informed consent obtained.  Time-out conducted.  Noted no overlying erythema, induration, or other signs of local infection.  Skin prepped in a sterile fashion.  Local anesthesia: Topical Ethyl chloride.  With sterile technique and under real time ultrasound guidance: Mild effusion noted, 30 mg/2 mL of OrthoVisc (sodium hyaluronate) in a prefilled syringe was injected easily into the knee through a 22-gauge needle.  Completed without difficulty  Advised to call if fevers/chills, erythema, induration, drainage, or persistent bleeding.  Images permanently stored and available for review in PACS.  Impression: Technically successful ultrasound guided injection.  Brief History, Exam, Impression, and Recommendations:    Primary osteoarthritis of both knees Orthovisc No. 4 of 4 left knee, return as needed  Osteochondral lesion of talar dome Doing much better with custom orthotics    ____________________________________________ Gwen Her. Dianah Field, M.D., ABFM., CAQSM., AME. Primary Care and Sports Medicine [place] [facility]  Adjunct Professor of [place] of [facility] of Medicine  Risk manager

## 2022-12-17 ENCOUNTER — Other Ambulatory Visit (INDEPENDENT_AMBULATORY_CARE_PROVIDER_SITE_OTHER): Payer: BC Managed Care – PPO

## 2022-12-17 ENCOUNTER — Ambulatory Visit: Payer: BC Managed Care – PPO | Admitting: Sports Medicine

## 2022-12-17 DIAGNOSIS — M170 Bilateral primary osteoarthritis of knee: Secondary | ICD-10-CM

## 2022-12-17 NOTE — Assessment & Plan Note (Signed)
Very pleasant 57 year old female, known knee osteoarthritis, we last did Orthovisc left knee finished the series November 30, 2022, she is having recurrence of effusion with some pain. We will go ahead and do an aspiration and injection today. Return to see me as needed, if persistence or recurrence of discomfort she will need consultation with orthopedic surgery.

## 2022-12-17 NOTE — Progress Notes (Signed)
    Procedures performed today:    Procedure: Real-time Ultrasound Guided Aspiration/injection of left knee Device: Samsung HS60  Verbal informed consent obtained.  Time-out conducted.  Noted no overlying erythema, induration, or other signs of local infection.  Skin prepped in a sterile fashion.  Local anesthesia: Topical Ethyl chloride.  With sterile technique and under real time ultrasound guidance: Noted effusion, I aspirated 5 mL of clear, straw-colored fluid, syringe switched and 1 cc Kenalog 40, 2 cc lidocaine, 2 cc bupivacaine injected easily Completed without difficulty  Advised to call if fevers/chills, erythema, induration, drainage, or persistent bleeding.  Images permanently stored and available for review in PACS.  Impression: Technically successful ultrasound guided aspiration/injection.  Independent interpretation of notes and tests performed by another provider:   None.  Brief History, Exam, Impression, and Recommendations:    Primary osteoarthritis of both knees Very pleasant 57 year old female, known knee osteoarthritis, we last did Orthovisc left knee finished the series November 30, 2022, she is having recurrence of effusion with some pain. We will go ahead and do an aspiration and injection today. Return to see me as needed, if persistence or recurrence of discomfort she will need consultation with orthopedic surgery.    ____________________________________________ Ihor Austin. Benjamin Stain, M.D., ABFM., CAQSM., AME. Primary Care and Sports Medicine [place] [facility]  Adjunct Professor of Family Medicine  [place] [employer] [facility] of Medicine  Restaurant manager, fast food

## 2022-12-28 ENCOUNTER — Ambulatory Visit: Payer: BC Managed Care – PPO | Admitting: Sports Medicine

## 2023-02-11 ENCOUNTER — Other Ambulatory Visit: Payer: Self-pay | Admitting: Sports Medicine

## 2023-02-11 ENCOUNTER — Ambulatory Visit: Payer: BC Managed Care – PPO | Admitting: Sports Medicine

## 2023-02-11 DIAGNOSIS — M170 Bilateral primary osteoarthritis of knee: Secondary | ICD-10-CM

## 2023-02-11 MED ORDER — TRAMADOL HCL 50 MG PO TABS: 50.0000 mg | ORAL_TABLET | Freq: Three times a day (TID) | ORAL | 0 refills | Status: AC | PRN

## 2023-02-11 MED ORDER — TRAMADOL HCL 50 MG PO TABS: 50.0000 mg | ORAL_TABLET | Freq: Three times a day (TID) | ORAL | 0 refills | Status: DC | PRN

## 2023-02-11 NOTE — Assessment & Plan Note (Signed)
This pleasant 57 year old female returns, she has known bilateral severe knee osteoarthritis, she has had several steroid injections, the last of which was in April of this year, she had a series of Orthovisc which was finished in March of this year, she has had therapy, oral medications all without sufficient improvement, she did have a consultation with a [facility] orthopedist, it sounds like they were hesitant to do the knee replacement due to her age but also due to her weight. I would like a second opinion at [facility] orthopedics. I did explain the importance of weight loss and a BMI of 40 being the cutoff for complications. Will add some tramadol. We also have the option of genicular artery embolization with interventional radiology.

## 2023-02-11 NOTE — Progress Notes (Signed)
    Procedures performed today:    None.  Independent interpretation of notes and tests performed by another provider:   None.  Brief History, Exam, Impression, and Recommendations:    Primary osteoarthritis of both knees This pleasant 57 year old female returns, she has known bilateral severe knee osteoarthritis, she has had several steroid injections, the last of which was in April of this year, she had a series of Orthovisc which was finished in March of this year, she has had therapy, oral medications all without sufficient improvement, she did have a consultation with a [facility] orthopedist, it sounds like they were hesitant to do the knee replacement due to her age but also due to her weight. I would like a second opinion at [facility] orthopedics. I did explain the importance of weight loss and a BMI of 40 being the cutoff for complications. Will add some tramadol. We also have the option of genicular artery embolization with interventional radiology.    ____________________________________________ Ihor Austin. Benjamin Stain, M.D., ABFM., CAQSM., AME. Primary Care and Sports Medicine [place] [facility]  Adjunct Professor of Family Medicine  [place] of [facility] of Medicine  Restaurant manager, fast food

## 2024-05-12 ENCOUNTER — Encounter: Payer: Self-pay | Admitting: Sports Medicine
